# Patient Record
Sex: Male | Born: 1985
Health system: Southern US, Community
[De-identification: ages and names within clinical notes are randomized; demographics above are authoritative.]

---
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
## 2008-01-07 ENCOUNTER — Ambulatory Visit: Payer: Self-pay | Admitting: Internal Medicine

## 2009-09-29 ENCOUNTER — Ambulatory Visit: Payer: Self-pay | Admitting: Internal Medicine

## 2009-10-02 ENCOUNTER — Ambulatory Visit: Payer: Self-pay

## 2009-10-03 DIAGNOSIS — R002 Palpitations: Secondary | ICD-10-CM

## 2009-11-03 ENCOUNTER — Ambulatory Visit: Payer: Self-pay | Admitting: Internal Medicine

## 2009-11-10 ENCOUNTER — Encounter: Payer: Self-pay | Admitting: Internal Medicine

## 2010-08-09 NOTE — Assessment & Plan Note (Signed)
Summary: f2y/ gd   Visit Type:  2 yr  Primary Provider:  none  CC:  palpitations since Oct.10.  History of Present Illness: Mr Delcarlo is seen in followup for palpitations of two types, one abrupt in onset and offset, the other more gradual.  There has been a recent significant increase in frequency of episodes, now occuring 3/wk .  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 He does not use caffeine \\par  He brings in tracings from EMS today which we reviewed; These strips showed sinus tachycardia  Current Medications (verified): 1)  Claritin-D 24 Hour 10-240 Mg Xr24h-Tab (Loratadine-Pseudoephedrine) .... Prn  Allergies: 1)  ! Codeine  Vital Signs:  Patient profile:   25 year old male Height:      72 inches Weight:      190 pounds BMI:     25.86 Pulse rate:   71 / minute Pulse (ortho):   81 / minute BP sitting:   124 / 79 BP standing:   114 / 77  Vitals Entered By: Oswald Hillock (September 29, 2009 10:33 AM)  Serial Vital Signs/Assessments:  Time      Position  BP       Pulse  Resp  Temp     By 11:33 AM  Lying [place]  108/69   67                    Gypsy Balsam RN BSN 11:33 AM  Sitting   114/77   83                    Gypsy Balsam RN BSN 11:33 AM  Standing  114/77   81                    Gypsy Balsam RN BSN 1135      Standing  112/75   89                    Gypsy Balsam RN BSN 1138      Standing  110/75   85                    Gypsy Balsam RN BSN   Physical Exam  General:  The patient was alert and oriented in no acute distress. HEENT Normal.  Neck veins were flat, carotids were brisk.  Lungs were clear.  Heart sounds were regular without murmurs or gallops.  Abdomen was soft with active bowel sounds. There is no clubbing cyanosis or edema. Skin Warm and dry    Impression & Recommendations:  Problem # 1:  PALPITATIONS (ICD-785.1) The patient as different types of palpitations. The recording will  be important to try to clarify possible mechanisms and to define treatment strategies. I reviewed with him the potential possibilities and the need for specific targeting of treatment options. Orders: Event (Event)  Patient Instructions: 1)  Your physician has recommended that you wear an event monitor.  Event monitors are medical devices that record the heart's electrical activity. Doctors most often use these monitors to diagnose arrhythmias. Arrhythmias are problems with the speed or rhythm of the heartbeat. The monitor is a small, portable device. You can wear one while you do your normal daily activities. This is usually used to diagnose what is causing palpitations/syncope (passing out). 2)  Your physician recommends that you schedule a follow-up  appointment in: 1 month with Dr Graciela Husbands.

## 2010-08-09 NOTE — Procedures (Signed)
Summary: Holter and Event  Holter and Event   Imported By: Erle Crocker 11/10/2009 10:15:10  _____________________________________________________________________  External Attachment:    Type:   Image     Comment:   External Document

## 2010-08-09 NOTE — Assessment & Plan Note (Signed)
Summary: F/U EVENT MONITOR   Primary Provider:  none  CC:  f/u event monitors//.  History of Present Illness: Steven Gregory is seen in followup for palpitations of two types, one abrupt in onset and offset, the other more gradual.  At his last visit we be candidate recorder. He comes in to review it today.  His symptoms have been relatively stable. He notes that his palpitations are worse when it is hot and with prolonged upright positions.  Allergies (verified): 1)  ! Codeine  Past History:  Past Medical History: Last updated: 09/26/2009 History of tachy palpitations 2009  Notable for back pain  MRI     Past Surgical History: Last updated: 09/26/2009  Negative  Family History: Last updated: 09/26/2009 Father:  history of tachycardia  Social History: Last updated: 09/26/2009 Single  Alcohol Use - yes,drinks 5 or 6 glasses of alcohol a   week. Tobacco Use - No.  Drug Use - no  Vital Signs:  Patient profile:   25 year old male Height:      72 inches Weight:      195 pounds O2 Sat:      98 % Pulse rate:   78 / minute Pulse rhythm:   regular BP sitting:   126 / 74  (left arm) Cuff size:   regular  Vitals Entered By: Judithe Modest CMA (November 03, 2009 1:49 PM)  Physical Exam  General:  The patient was alert and oriented in no acute distress. HEENT Normal.  Neck veins were flat, carotids were brisk.  Lungs were clear.  Heart sounds were regular without murmurs or gallops.  Abdomen was soft with active bowel sounds. There is no clubbing cyanosis or edema. Skin Warm and dry    Impression & Recommendations:  Problem # 1:  PALPITATIONS (ICD-785.1) Review of the strips suggest that the are probably sinus tachycardia and probably autonomic. I continued to encourage him  in water and salt resuscitation.  We discussed the role of beta blockers and catheter ablation. I am unwilling at this point to pursue the latter and he is uninterested in the current time in  pursuing the former. We'll continue on her current course

## 2010-08-31 ENCOUNTER — Emergency Department (HOSPITAL_COMMUNITY)
Admission: EM | Admit: 2010-08-31 | Discharge: 2010-08-31 | Disposition: A | Discharge status: Home / Self Care | Payer: 59 | Attending: Emergency Medicine | Admitting: Emergency Medicine

## 2010-08-31 ENCOUNTER — Emergency Department (HOSPITAL_COMMUNITY): Payer: 59

## 2010-08-31 DIAGNOSIS — M25569 Pain in unspecified knee: Secondary | ICD-10-CM | POA: Insufficient documentation

## 2010-08-31 DIAGNOSIS — S838X9A Sprain of other specified parts of unspecified knee, initial encounter: Secondary | ICD-10-CM | POA: Insufficient documentation

## 2010-08-31 DIAGNOSIS — X58XXXA Exposure to other specified factors, initial encounter: Secondary | ICD-10-CM | POA: Insufficient documentation

## 2010-08-31 DIAGNOSIS — Y9367 Activity, basketball: Secondary | ICD-10-CM | POA: Insufficient documentation

## 2010-09-28 ENCOUNTER — Telehealth: Payer: Self-pay | Admitting: Internal Medicine

## 2010-09-28 NOTE — Telephone Encounter (Signed)
All Cardiac faxed to [facility] @ [phone] 09/28/10/KM

## 2010-11-20 NOTE — Letter (Signed)
January 07, 2008    Dr. Paulino Rily  [facility]  [phone]  [place], [place]  [address]   RE:  Steven, Gregory  MRN:  [id number]  /  DOB:  1986-01-01   Dear Dr. Yetta Barre:   It was a pleasure to see your name again.  It has been some time.  Thank  you very much for asking [place] to see Steven Gregory in consultation.   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 Lorna Few  years ago when he was a child.  These episodes could be rather fast and  regular, and he recalled of being treated with vagal maneuvers and/or  just chilling out.  His father turned out to have also a history of  tachycardia and underwent an ablation here in [place] presumably for  concealed accessory pathways which has been a long-term success.  In May  of this year, he was writing at work and found himself dyspneic while  talking.  He took his heart rate and apparently recorded a strip.  We do  not have access to what his heart rate at, but I am sure what his rate  was, identified to be between 110 and 160 beats per minute.  This lasted  for 3-4 days.  He finally got around and coming to see you, and his ECG  at that point was different.  Clearly, this was different from his other  episodes in that  a.  It was unassociated with palpitations at least at the beginning.  b.  It did not terminate abruptly.   Apart from that episode he has had no problems with chest pain,  shortness of breath, edema, or nocturnal dyspnea.   He does not take stimulants.  His thyroid status is not known as best as  I can tell.   PAST MEDICAL HISTORY:  Notable for back pain.  He is undergoing an MRI  in the next day or so to look at that.   PAST SURGICAL HISTORY:  Negative.   SOCIAL HISTORY:  He is unmarried.  He drinks 5 or 6 glasses of alcohol a  week.  He does not use cigarettes or recreational drugs.  He has no  children.   MEDICATIONS:  None.   ALLERGIES:  Codeine.   PHYSICAL EXAMINATION:  GENERAL:  A young Caucasian male, appearing in  his stated age.  VITAL SIGNS:  His blood pressure 112/82 with pulse of 68.  HEENT:  No icterus or xanthoma.  NECK:  The neck veins were flat.  The carotids are brisk and full  bilaterally without bruits.  BACK:  Without kyphosis or scoliosis.  LUNGS:  Clear.  HEART:  Sounds were regular without murmurs or gallops.  ABDOMEN:  Soft with active bowel sounds without midline pulsation or  hepatomegaly.  EXTREMITIES:  Femoral pulses were 2+.  Distal pulses were intact.  There  was no clubbing, cyanosis, or edema.  NEUROLOGIC:  Grossly normal.  SKIN:  Warm and dry.   Electrocardiogram dated today demonstrated sinus rhythm at 68 with  intervals of 0.19, 0.10, 0.37.  Notably, there was no intraventricular  pre-excitation.   IMPRESSION:  1. Palpitations of 2 different sorts,      a.     Recently with associated dyspnea and protracted for a couple       of days.  b.     Abrupt onset and offset with more rapid rates through the       child most recently at 61 years old.   DISCUSSION:  Dr. Yetta Barre, Steven Gregory has 2 different types of  palpitation syndromes.  It is not clear what the second one is.  The  first sounds that it might have bene a typical reentrant tachycardia  which would make it likely to be a concealed pathway like his father's.  This is suggested by the abrupt onset and offset as well as the response  to Valsalva and the regular nature of the tachycardia.   The most episode had quite variable rates the mechanism of which is not  clear.  He has a strip.  I have asked him to try and gets [place] a copy of  the strip, so we can try and understand the mechanism of the variable  heart rates.   As relates to followup what he wanted to do was to be established so  that if he had a problem he could get back in touch with [place] and I  thought that was  quite reasonable.   Thank you very much for asking [place] to see him.  If there is anything  further we can do, please do not hesitate to contact me.    Sincerely,      Duke Salvia, MD, [facility]  Electronically Signed    SCK/MedQ  DD: 01/07/2008  DT: 01/08/2008  Job #: [id number]

## 2011-06-18 ENCOUNTER — Ambulatory Visit: Payer: Self-pay | Admitting: Physician Assistant

## 2013-01-28 ENCOUNTER — Ambulatory Visit (HOSPITAL_COMMUNITY)
Admission: RE | Admit: 2013-01-28 | Discharge: 2013-01-28 | Disposition: A | Discharge status: Home / Self Care | Payer: 59 | Source: Ambulatory Visit | Attending: Nurse Practitioner | Admitting: Nurse Practitioner

## 2013-01-28 ENCOUNTER — Other Ambulatory Visit (HOSPITAL_COMMUNITY): Payer: Self-pay | Admitting: Nurse Practitioner

## 2013-01-28 DIAGNOSIS — R109 Unspecified abdominal pain: Secondary | ICD-10-CM

## 2015-10-25 ENCOUNTER — Ambulatory Visit
Admission: RE | Admit: 2015-10-25 | Discharge: 2015-10-25 | Disposition: A | Discharge status: Home / Self Care | Payer: 59 | Source: Ambulatory Visit | Attending: Family Medicine | Admitting: Family Medicine

## 2015-10-25 ENCOUNTER — Other Ambulatory Visit: Payer: Self-pay | Admitting: Family Medicine

## 2015-10-25 DIAGNOSIS — M545 Low back pain, unspecified: Secondary | ICD-10-CM

## 2017-02-20 DIAGNOSIS — M25551 Pain in right hip: Secondary | ICD-10-CM | POA: Diagnosis not present

## 2017-02-20 DIAGNOSIS — Z0000 Encounter for general adult medical examination without abnormal findings: Secondary | ICD-10-CM | POA: Diagnosis not present

## 2017-02-20 DIAGNOSIS — Z13220 Encounter for screening for lipoid disorders: Secondary | ICD-10-CM | POA: Diagnosis not present

## 2017-06-10 IMAGING — CR DG LUMBAR SPINE COMPLETE 4+V
5 series · 5 of 5 positions shown · non-contrast
Comparison: CT [REDACTED], plain films 03/18/2010

CLINICAL DATA: Chronic worsening LBP x 6 months / Right sciatic
area into buttock and upper thigh / no trauma / pt. states ''feels
like one side is higher than the other''

EXAM:
LUMBAR SPINE - COMPLETE 4+ VIEW

[w lumbar spine ap]
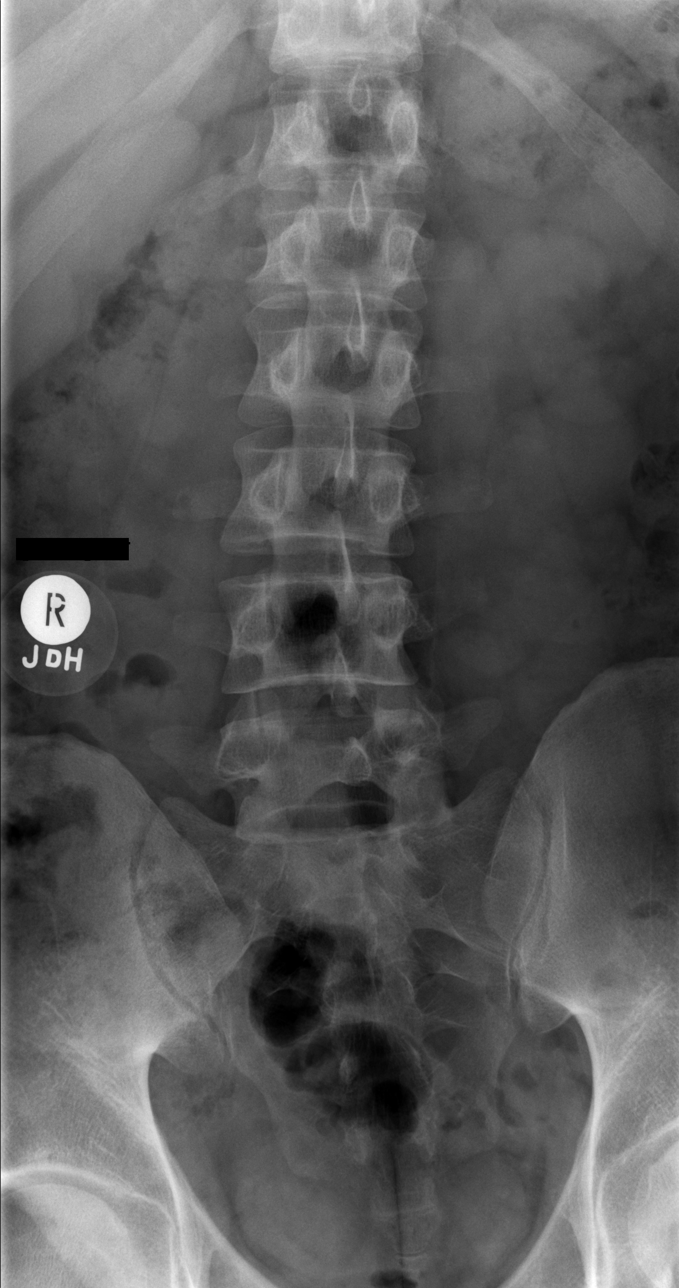

[w lumbar spine obl (1 of 2)]
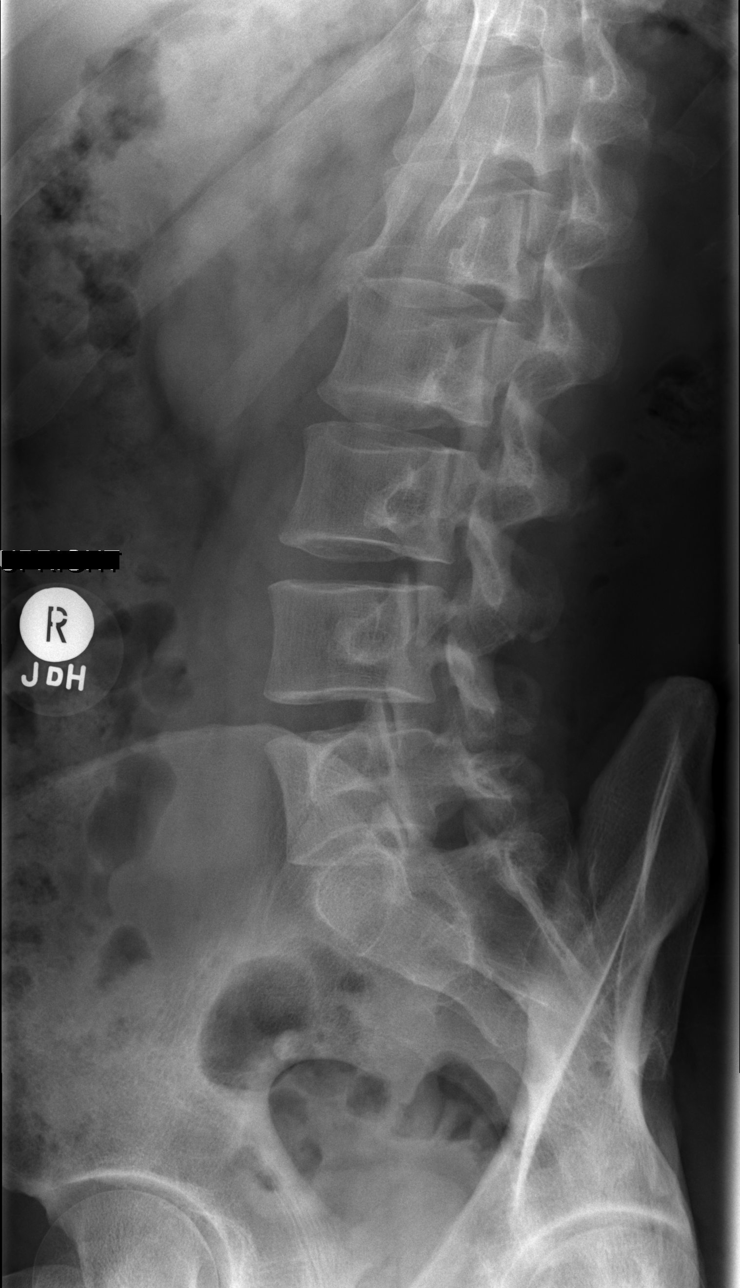

[w lumbar spine obl (2 of 2)]
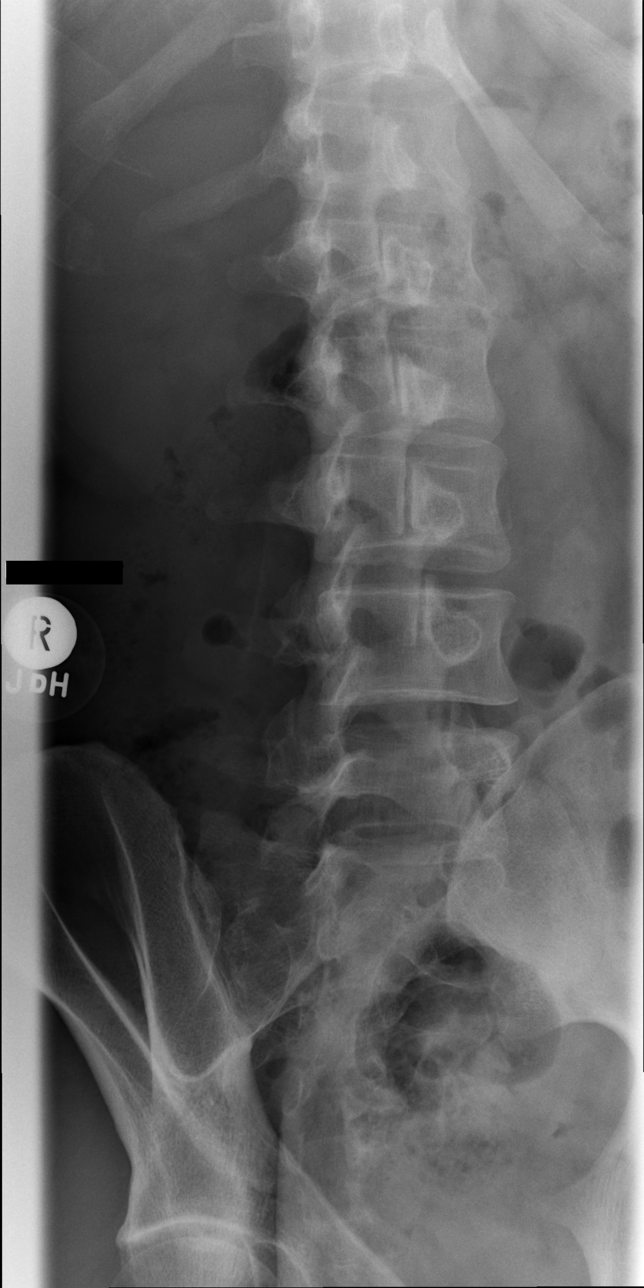

[w lumbar spine lat]
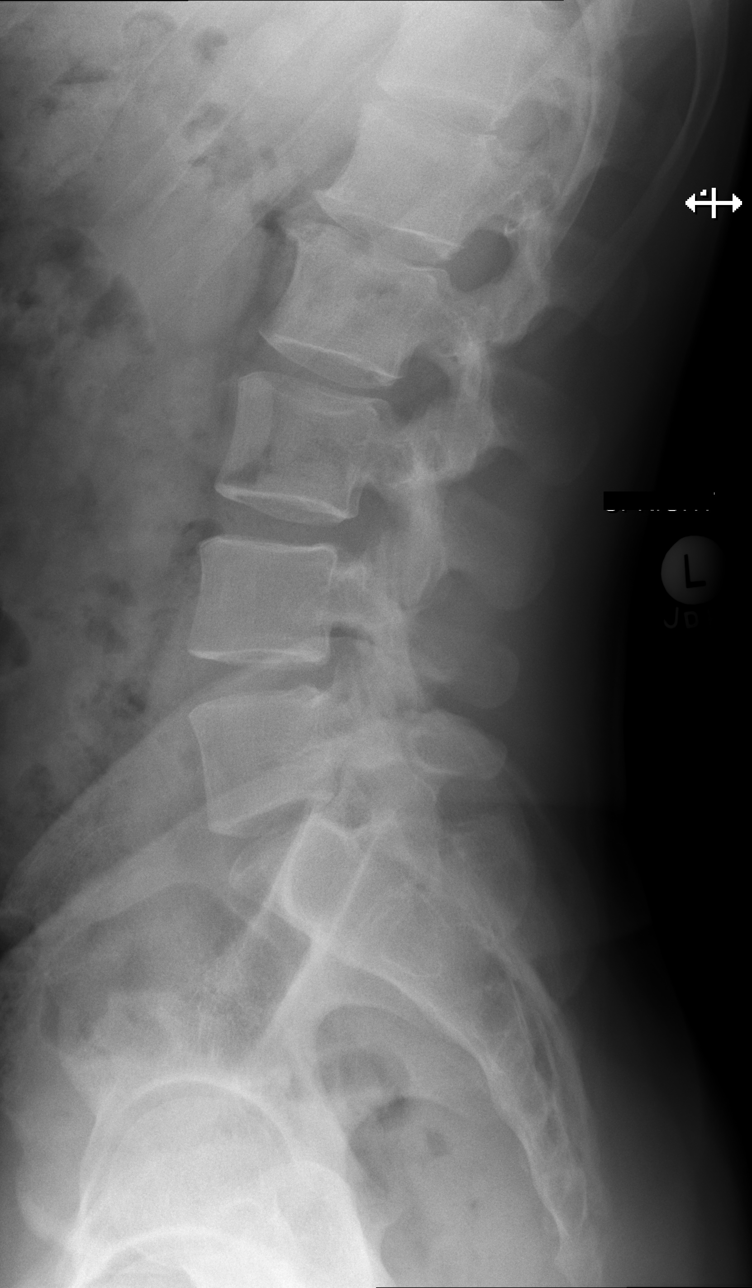

[w lumbar l-5 s-1 spot]
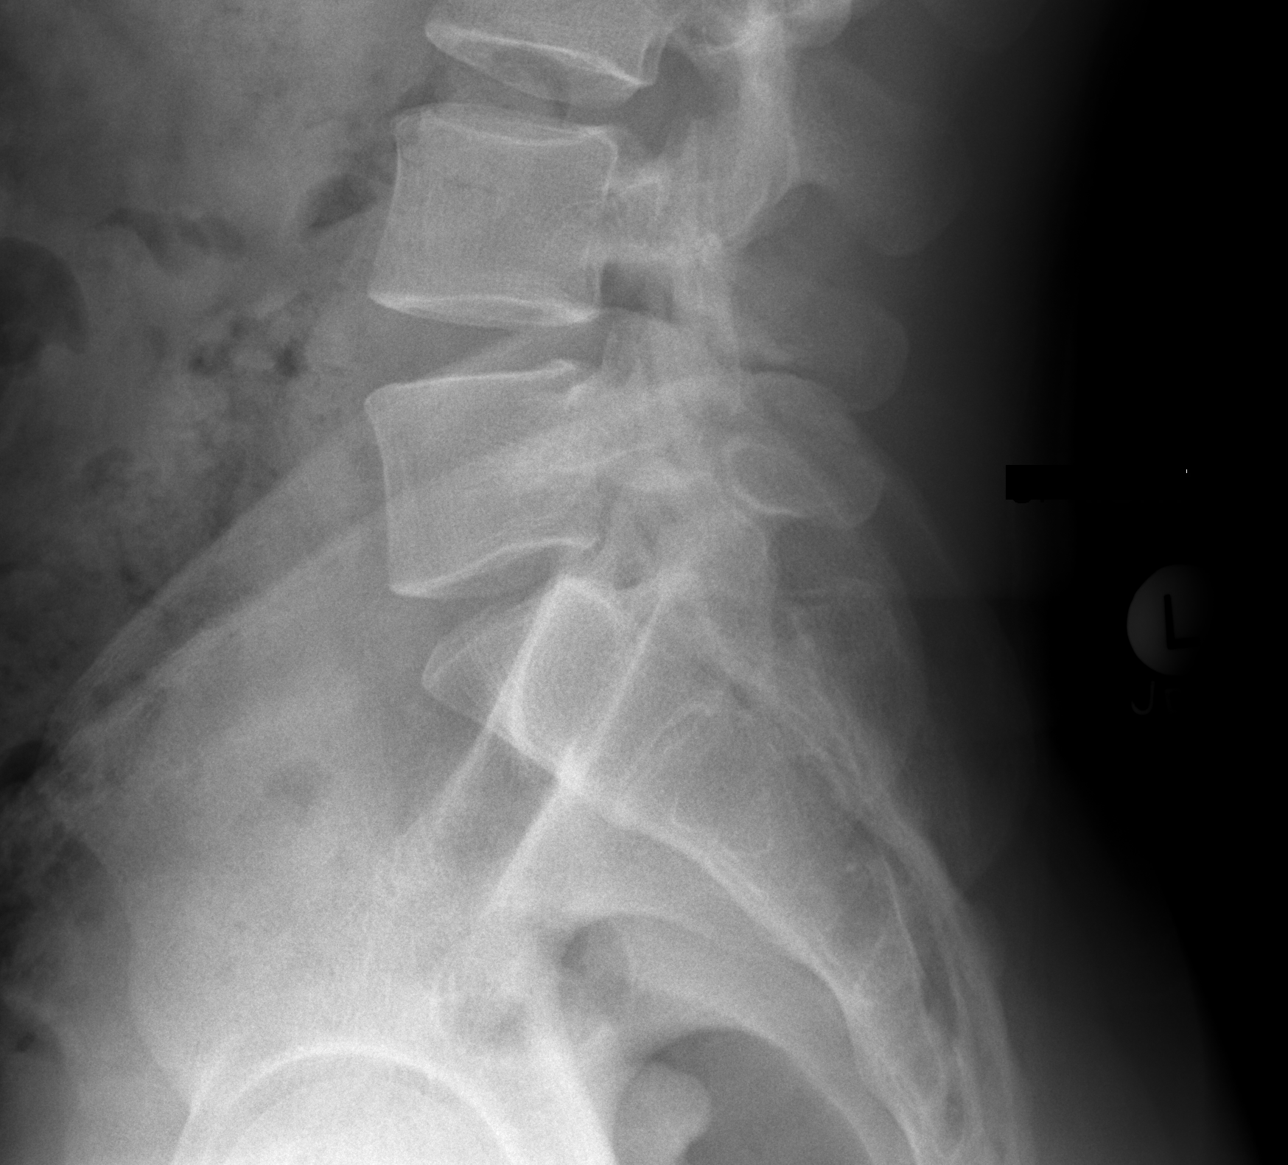

[5 of 5 positions shown; findings below may reference images not displayed]

FINDINGS: Normal alignment of lumbar vertebral bodies. No loss of vertebral
body height or disc height. No pars fracture. No subluxation. Mild
endplate spurring at L1-L2.
IMPRESSION: No acute osseous abnormality.

## 2017-08-22 DIAGNOSIS — D751 Secondary polycythemia: Secondary | ICD-10-CM | POA: Diagnosis not present

## 2018-02-24 DIAGNOSIS — Z0000 Encounter for general adult medical examination without abnormal findings: Secondary | ICD-10-CM | POA: Diagnosis not present

## 2018-03-06 DIAGNOSIS — Z136 Encounter for screening for cardiovascular disorders: Secondary | ICD-10-CM | POA: Diagnosis not present

## 2018-03-06 DIAGNOSIS — Z0000 Encounter for general adult medical examination without abnormal findings: Secondary | ICD-10-CM | POA: Diagnosis not present

## 2018-06-24 DIAGNOSIS — J069 Acute upper respiratory infection, unspecified: Secondary | ICD-10-CM | POA: Diagnosis not present

## 2018-06-29 DIAGNOSIS — J069 Acute upper respiratory infection, unspecified: Secondary | ICD-10-CM | POA: Diagnosis not present

## 2018-06-29 DIAGNOSIS — R6889 Other general symptoms and signs: Secondary | ICD-10-CM | POA: Diagnosis not present

## 2018-07-04 DIAGNOSIS — J019 Acute sinusitis, unspecified: Secondary | ICD-10-CM | POA: Diagnosis not present

## 2018-07-12 DIAGNOSIS — R05 Cough: Secondary | ICD-10-CM | POA: Diagnosis not present

## 2018-08-12 ENCOUNTER — Ambulatory Visit (INDEPENDENT_AMBULATORY_CARE_PROVIDER_SITE_OTHER): Payer: 59 | Admitting: Psychology

## 2018-08-12 DIAGNOSIS — F4323 Adjustment disorder with mixed anxiety and depressed mood: Secondary | ICD-10-CM

## 2018-08-26 ENCOUNTER — Ambulatory Visit (INDEPENDENT_AMBULATORY_CARE_PROVIDER_SITE_OTHER): Payer: 59 | Admitting: Psychology

## 2018-08-26 DIAGNOSIS — F4323 Adjustment disorder with mixed anxiety and depressed mood: Secondary | ICD-10-CM

## 2018-09-09 ENCOUNTER — Ambulatory Visit (INDEPENDENT_AMBULATORY_CARE_PROVIDER_SITE_OTHER): Payer: 59 | Admitting: Psychology

## 2018-09-09 DIAGNOSIS — F423 Hoarding disorder: Secondary | ICD-10-CM

## 2018-09-18 DIAGNOSIS — K529 Noninfective gastroenteritis and colitis, unspecified: Secondary | ICD-10-CM | POA: Diagnosis not present

## 2018-09-18 DIAGNOSIS — R1030 Lower abdominal pain, unspecified: Secondary | ICD-10-CM | POA: Diagnosis not present

## 2018-09-18 DIAGNOSIS — R197 Diarrhea, unspecified: Secondary | ICD-10-CM | POA: Diagnosis not present

## 2018-09-23 ENCOUNTER — Ambulatory Visit: Payer: 59 | Admitting: Psychology

## 2018-09-24 ENCOUNTER — Ambulatory Visit (INDEPENDENT_AMBULATORY_CARE_PROVIDER_SITE_OTHER): Payer: 59 | Admitting: Psychology

## 2018-09-24 DIAGNOSIS — F4323 Adjustment disorder with mixed anxiety and depressed mood: Secondary | ICD-10-CM | POA: Diagnosis not present

## 2018-10-08 ENCOUNTER — Ambulatory Visit (INDEPENDENT_AMBULATORY_CARE_PROVIDER_SITE_OTHER): Payer: 59 | Admitting: Psychology

## 2018-10-08 DIAGNOSIS — F4323 Adjustment disorder with mixed anxiety and depressed mood: Secondary | ICD-10-CM

## 2018-11-05 ENCOUNTER — Ambulatory Visit: Payer: 59 | Admitting: Psychology

## 2018-11-25 ENCOUNTER — Ambulatory Visit (INDEPENDENT_AMBULATORY_CARE_PROVIDER_SITE_OTHER): Payer: 59 | Admitting: Psychology

## 2018-11-25 DIAGNOSIS — F4323 Adjustment disorder with mixed anxiety and depressed mood: Secondary | ICD-10-CM | POA: Diagnosis not present

## 2018-12-11 ENCOUNTER — Ambulatory Visit (INDEPENDENT_AMBULATORY_CARE_PROVIDER_SITE_OTHER): Payer: 59 | Admitting: Psychology

## 2018-12-11 DIAGNOSIS — F4323 Adjustment disorder with mixed anxiety and depressed mood: Secondary | ICD-10-CM

## 2019-01-15 ENCOUNTER — Ambulatory Visit (INDEPENDENT_AMBULATORY_CARE_PROVIDER_SITE_OTHER): Payer: 59 | Admitting: Psychology

## 2019-01-15 DIAGNOSIS — F4323 Adjustment disorder with mixed anxiety and depressed mood: Secondary | ICD-10-CM

## 2019-01-27 ENCOUNTER — Ambulatory Visit (INDEPENDENT_AMBULATORY_CARE_PROVIDER_SITE_OTHER): Payer: 59 | Admitting: Psychology

## 2019-01-27 DIAGNOSIS — F4323 Adjustment disorder with mixed anxiety and depressed mood: Secondary | ICD-10-CM

## 2019-02-10 ENCOUNTER — Ambulatory Visit (INDEPENDENT_AMBULATORY_CARE_PROVIDER_SITE_OTHER): Payer: 59 | Admitting: Psychology

## 2019-02-10 DIAGNOSIS — F4323 Adjustment disorder with mixed anxiety and depressed mood: Secondary | ICD-10-CM

## 2019-03-01 ENCOUNTER — Ambulatory Visit (INDEPENDENT_AMBULATORY_CARE_PROVIDER_SITE_OTHER): Payer: 59 | Admitting: Psychology

## 2019-03-01 DIAGNOSIS — F4323 Adjustment disorder with mixed anxiety and depressed mood: Secondary | ICD-10-CM

## 2019-03-16 ENCOUNTER — Ambulatory Visit (INDEPENDENT_AMBULATORY_CARE_PROVIDER_SITE_OTHER): Payer: 59 | Admitting: Psychology

## 2019-03-16 DIAGNOSIS — F4323 Adjustment disorder with mixed anxiety and depressed mood: Secondary | ICD-10-CM

## 2019-05-25 ENCOUNTER — Ambulatory Visit (INDEPENDENT_AMBULATORY_CARE_PROVIDER_SITE_OTHER): Payer: 59 | Admitting: Psychology

## 2019-05-25 DIAGNOSIS — F4323 Adjustment disorder with mixed anxiety and depressed mood: Secondary | ICD-10-CM | POA: Diagnosis not present

## 2019-06-08 ENCOUNTER — Ambulatory Visit: Payer: 59 | Admitting: Psychology

## 2019-08-18 ENCOUNTER — Ambulatory Visit
Admission: EM | Admit: 2019-08-18 | Discharge: 2019-08-18 | Disposition: A | Discharge status: Home / Self Care | Payer: 59 | Attending: Emergency Medicine | Admitting: Emergency Medicine

## 2019-08-18 ENCOUNTER — Encounter: Payer: Self-pay | Admitting: Emergency Medicine

## 2019-08-18 ENCOUNTER — Other Ambulatory Visit: Payer: Self-pay

## 2019-08-18 ENCOUNTER — Ambulatory Visit (INDEPENDENT_AMBULATORY_CARE_PROVIDER_SITE_OTHER): Payer: 59

## 2019-08-18 DIAGNOSIS — M79672 Pain in left foot: Secondary | ICD-10-CM

## 2019-08-18 NOTE — Discharge Instructions (Signed)

## 2019-08-18 NOTE — ED Provider Notes (Signed)
[facility]    CSN: [id number] Arrival date & time: 08/18/19  1912      History   Chief Complaint Chief Complaint  Patient presents with  . Foot Pain    HPI Steven Gregory is a 34 y.o. male never assessment of left foot pain.  States initially in 5 to 6 weeks ago after increased activity level (long months).  States last night his son patient tried car into his foot which caused him to jump when he landed felt a severe sharp pain that went from the lateral aspect of his foot to his ankle.  Patient does have mild antalgic gait, states it has been bothering him while he is at work and with exertion.  Has used ibuprofen with some relief.   History reviewed. No pertinent past medical history.  Patient Active Problem List   Diagnosis Date Noted  . PALPITATIONS 10/03/2009    Past Surgical History:  Procedure Laterality Date  . ANTERIOR CRUCIATE LIGAMENT REPAIR Right        Home Medications    Prior to Admission medications   Medication Sig Start Date End Date Taking? Authorizing Provider  amphetamine-dextroamphetamine (ADDERALL XR) 20 MG 24 hr capsule Take 20 mg by mouth daily.   Yes [provider]    Family History Family History  Problem Relation Age of Onset  . Kidney Stones Mother     Social History Social History   Tobacco Use  . Smoking status: Never Smoker  . Smokeless tobacco: Current User    Types: Chew  Substance Use Topics  . Alcohol use: Yes    Comment: socially  . Drug use: Never     Allergies   Codeine and Penicillins   Review of Systems As per HPI   Physical Exam Triage Vital Signs ED Triage Vitals [08/18/19 1920]  Enc Vitals Group     BP (!) 155/98     Pulse Rate 96     Resp 16     Temp      Temp src      SpO2 98 %     Weight      Height      Head Circumference      Peak Flow      Pain Score 6     Pain Loc      Pain Edu?      Excl. in [place]?    No data found.  Updated Vital Signs BP (!)  147/91 (BP Location: Left Arm)   Pulse 96   Resp 16   SpO2 98%   Visual Acuity Right Eye Distance:   Left Eye Distance:   Bilateral Distance:    Right Eye Near:   Left Eye Near:    Bilateral Near:     Physical Exam Constitutional:      General: He is not in acute distress. HENT:     Head: Normocephalic and atraumatic.  Eyes:     General: No scleral icterus.    Pupils: Pupils are equal, round, and reactive to light.  Cardiovascular:     Rate and Rhythm: Normal rate.  Pulmonary:     Effort: Pulmonary effort is normal. No respiratory distress.     Breath sounds: No wheezing.  Musculoskeletal:     Right ankle: Normal.     Right Achilles Tendon: Normal.     Left ankle: Normal.     Left Achilles Tendon: Normal.     Right foot: Normal.  Left foot: Normal range of motion and normal capillary refill. Bony tenderness present. No prominent metatarsal heads, tenderness or crepitus. Normal pulse.       Legs:     Comments: TTP in red  Skin:    Coloration: Skin is not jaundiced or pale.  Neurological:     Mental Status: He is alert and oriented to person, place, and time.      UC Treatments / Results  Labs (all labs ordered are listed, but only abnormal results are displayed) Labs Reviewed - No data to display  EKG   Radiology DG Foot Complete Left  Result Date: 08/18/2019 CLINICAL DATA:  Acute on chronic pain, left foot pain for 5-6 weeks EXAM: LEFT FOOT - COMPLETE 3+ VIEW COMPARISON:  None. FINDINGS: Frontal, oblique, lateral views of the left foot are obtained. No fracture, subluxation, or dislocation. Joint spaces are well preserved. Soft tissues are normal. IMPRESSION: 1. Unremarkable left foot. Electronically Signed   By: Sharlet Salina M.D.   On: 08/18/2019 19:43    Procedures Procedures (including critical care time)  Medications Ordered in UC Medications - No data to display  Initial Impression / Assessment and Plan / UC Course  I have reviewed the  triage vital signs and the nursing notes.  Pertinent labs & imaging results that were available during my care of the patient were reviewed by me and considered in my medical decision making (see chart for details).     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  X-ray done in office, reviewed by me radiology: Unremarkable for dislocation, fracture.  Reviewed findings with patient verbalized understanding.  Will practice RICE as outlined below.  Return precautions discussed, patient verbalized understanding and is agreeable to plan. Final Clinical Impressions(s) / UC Diagnoses   Final diagnoses:  Left foot pain     Discharge Instructions     Recommend RICE: rest, ice, compression, elevation as needed for pain.    Heat therapy (hot compress, warm wash red, hot showers, etc.) can help relax muscles and soothe muscle aches. Cold therapy (ice packs) can be used to help swelling both after injury and after prolonged use of areas of chronic pain/aches.  For pain: recommend 350 mg-1000 mg of Tylenol (acetaminophen) and/or 200 mg - 800 mg of Advil (ibuprofen, Motrin) every 8 hours as needed.  May alternate between the two throughout the day as they are generally safe to take together.  DO NOT exceed more than 3000 mg of Tylenol or 3200 mg of ibuprofen in a 24 hour period as this could damage your stomach, kidneys, liver, or increase your bleeding risk.    ED Prescriptions    None     PDMP not reviewed this encounter.   [place], [place], [place] 08/18/19 2001

## 2019-08-18 NOTE — ED Triage Notes (Signed)
Pt presents to [facility] for assessment of 5-6 weeks of left foot pain beginning after a long run.  States he was reducing exertion and use, using ibuprofen, and it was improving.  States his son pushed a toy car into his foot last night and he jumped, and when he came down to land on it he felt a severe pain that shot up into his leg and "wrapped around my ankle".    Ankle pain is now intermittent with certain movements, and gait is abnormal due to pain.

## 2019-11-03 ENCOUNTER — Other Ambulatory Visit: Payer: Self-pay

## 2019-11-03 ENCOUNTER — Ambulatory Visit (INDEPENDENT_AMBULATORY_CARE_PROVIDER_SITE_OTHER): Payer: 59 | Admitting: Mental Health

## 2019-11-03 DIAGNOSIS — F4323 Adjustment disorder with mixed anxiety and depressed mood: Secondary | ICD-10-CM | POA: Diagnosis not present

## 2019-11-03 NOTE — Progress Notes (Signed)
[facility] Initial Adult Exam  Name: Steven Gregory Date: 11/03/2019 MRN: [id number] DOB: Dec 09, 1985 PCP: Patient, No Pcp Per  Time spent: 53 minutes  Reason for Visit /Presenting Problem:  Pt wants to continue to grow, learn more about himself. He was in therapy last year for about 9 months. His wife was coping w/ post-partum. He was coping w/ some guilt/shame.  Captain in the paramedic team, he began talking with a coworker with which he spent many hours a day.  He was busy, wife was pregnant, he would do a lot around the house, tired often. Began to talk to this male co-worker where they eventually had an affair, he still has some feelings for her. He had to live w/ friends as he and his wife separated. He continued to see this coworker and at times his wife. He and his wife are currently trying to work things out.  Due to his continuing to have some feelings for the coworker with which he had the affair, he wants to improve his ability to further understand them and how they are continuing to impact his life.  He stated he is having some dreams over the past month where she is often in them, the dreams are not disturbing but that she present at some point in them.  One of his closest friends of many years is now dating her and he has had to set boundaries with them, cutting off communication and with another close friend of many years which is also now friends with her.  Mental Status Exam:   Appearance:   Casual     Behavior:  Appropriate  Motor:  Normal  Speech/Language:   Clear and Coherent  Affect:  Full Range  Mood:  anxious  Thought process:  normal  Thought content:    WNL  Sensory/Perceptual disturbances:    WNL  Orientation:  x4  Attention:  Good  Concentration:  Good  Memory:  WNL  Fund of knowledge:   Good  Insight:    Good  Judgment:   Good  Impulse Control:  Good   Reported Symptoms:   Anxiety, depressed mood, some sleep deficits, rumination at  times  Risk Assessment: Danger to Self:  No Self-injurious Behavior: No Danger to Others: No Duty to Warn:no Physical Aggression / Violence:No  Access to Firearms a concern: No  Gang Involvement:no  Patient / guardian was educated about steps to take if suicide or homicide risk level increases between visits: yes While future psychiatric events cannot be accurately predicted, the patient does not currently require acute inpatient psychiatric care and does not currently meet [facility] involuntary commitment criteria.  Substance Abuse History: Current substance abuse: denied   Past Psychiatric History:   Outpatient Providers: last year for about 9 months istory of Psych Hospitalization: No  Psychological Testing: none   Family History:  Family History  Problem Relation Age of Onset  . Kidney Stones Mother     Medical History/Surgical History: No past medical history on file.  Past Surgical History:  Procedure Laterality Date  . ANTERIOR CRUCIATE LIGAMENT REPAIR Right     Medications: Current Outpatient Medications  Medication Sig Dispense Refill  . amphetamine-dextroamphetamine (ADDERALL XR) 20 MG 24 hr capsule Take 20 mg by mouth daily.     No current facility-administered medications for this visit.    Allergies  Allergen Reactions  . Codeine   . Penicillins Hives    Diagnoses:    ICD-10-CM   1.  Adjustment disorder with mixed anxiety and depressed mood  F43.23     Plan of Care: TBD  Anson [place], [facility]

## 2019-11-25 ENCOUNTER — Ambulatory Visit: Payer: 59 | Admitting: Mental Health

## 2019-12-24 ENCOUNTER — Other Ambulatory Visit: Payer: Self-pay

## 2019-12-24 ENCOUNTER — Ambulatory Visit (INDEPENDENT_AMBULATORY_CARE_PROVIDER_SITE_OTHER): Payer: 59 | Admitting: Mental Health

## 2019-12-24 DIAGNOSIS — F4323 Adjustment disorder with mixed anxiety and depressed mood: Secondary | ICD-10-CM

## 2019-12-24 NOTE — Progress Notes (Signed)
[facility] Psychotherapy Note  Name: Steven Gregory Date: 12/24/2019 MRN: [id number] DOB: March 15, 1986 PCP: Patient, No Pcp Per  Time spent: 53 minutes  Treatment:   Individual therapy  Mental Status Exam:   Appearance:   Casual     Behavior:  Appropriate  Motor:  Normal  Speech/Language:   Clear and Coherent  Affect:  Full Range  Mood:  anxious  Thought process:  normal  Thought content:    WNL  Sensory/Perceptual disturbances:    WNL  Orientation:  x4  Attention:  Good  Concentration:  Good  Memory:  WNL  Fund of knowledge:   Good  Insight:    Good  Judgment:   Good  Impulse Control:  Good   Reported Symptoms:   Anxiety, depressed mood, some sleep deficits, rumination at times  Risk Assessment: Danger to Self:  No Self-injurious Behavior: No Danger to Others: No Duty to Warn:no Physical Aggression / Violence:No  Access to Firearms a concern: No  Gang Involvement:no  Patient / guardian was educated about steps to take if suicide or homicide risk level increases between visits: yes While future psychiatric events cannot be accurately predicted, the patient does not currently require acute inpatient psychiatric care and does not currently meet [facility] involuntary commitment criteria.  Substance Abuse History: Current substance abuse: denied   Past Psychiatric History:   Outpatient Providers: last year for about 9 months istory of Psych Hospitalization: No  Psychological Testing: none   Family History:  Family History  Problem Relation Age of Onset  . Kidney Stones Mother    Medications: Current Outpatient Medications  Medication Sig Dispense Refill  . amphetamine-dextroamphetamine (ADDERALL XR) 20 MG 24 hr capsule Take 20 mg by mouth daily.     No current facility-administered medications for this visit.    Allergies  Allergen Reactions  . Codeine   . Penicillins Hives   Subective: Patient presents for session on time.  We discussed  progress since last visit which was about 2 months ago.  He stated that he and his wife continue to work on the relationship and reports they are doing well.  Shared how they have a beach trip coming up this weekend and he looks forward to spending time together.  Through guided discovery, he identified wanting to "appropriate feelings of guilt".  He has been able to use his support system consisting of his mother and a close friend where he identified feeling responsible for negatively affecting the life of the woman he had an affair with last year.  He processed thoughts and feelings related to how he further understands more about that relationship at the time, how he feels she manipulated him in some ways, playing the victim at times to get him to stay in the relationship particularly when he tried to end it multiple times.  He notified feeling more guarded now with others in part due to the loss of one of his best friends as he began dating this woman.  She plans to take steps toward being somewhat more disclosing with others as he wants to change this recent urges to be more guarded around anyone who comes in contact with.  He identified struggling with trusting others and himself.   Interventions: CBT, supportive therapy  Diagnoses:    ICD-10-CM   1. Adjustment disorder with mixed anxiety and depressed mood  F43.23    Plan: Patient is to use CBT, mindfulness and coping skills to help manage decrease symptoms associated  with their diagnosis.  Patient to continue to identify and manage distressful feelings related to his relationships, taking steps toward continuing to improve his marital relationship and self forgiveness.   Long-term goal:   Reduce overall level, frequency, and intensity of the feelings of depression and anxiety 6-7/10 to a 0-2/10 in severity for at least 3 consecutive months.  Short-term goal:  Decrease "deflating, self-doubting" and catastrophizing thinking style Decrease  feelings of guilt  Continue to improve his marital relationship Utilize coping as discussed in session   Assessment of progress:  progressing  Anson [place], [facility]

## 2020-01-26 ENCOUNTER — Other Ambulatory Visit: Payer: Self-pay

## 2020-01-26 ENCOUNTER — Ambulatory Visit (INDEPENDENT_AMBULATORY_CARE_PROVIDER_SITE_OTHER): Payer: 59 | Admitting: Mental Health

## 2020-01-26 DIAGNOSIS — F4323 Adjustment disorder with mixed anxiety and depressed mood: Secondary | ICD-10-CM | POA: Diagnosis not present

## 2020-01-26 NOTE — Progress Notes (Signed)
[facility] Psychotherapy Note  Name: Steven Gregory Date: 01/26/2020 MRN: [id number] DOB: April 30, 1986 PCP: Patient, No Pcp Per  Time spent: 54 minutes  Treatment:   Individual therapy  Mental Status Exam:   Appearance:   Casual     Behavior:  Appropriate  Motor:  Normal  Speech/Language:   Clear and Coherent  Affect:  Full Range  Mood:  euthymic  Thought process:  normal  Thought content:    WNL  Sensory/Perceptual disturbances:    WNL  Orientation:  x4  Attention:  Good  Concentration:  Good  Memory:  WNL  Fund of knowledge:   Good  Insight:    Good  Judgment:   Good  Impulse Control:  Good   Reported Symptoms:   Anxiety, depressed mood, some sleep deficits, rumination at times  Risk Assessment: Danger to Self:  No Self-injurious Behavior: No Danger to Others: No Duty to Warn:no Physical Aggression / Violence:No  Access to Firearms a concern: No  Gang Involvement:no  Patient / guardian was educated about steps to take if suicide or homicide risk level increases between visits: yes While future psychiatric events cannot be accurately predicted, the patient does not currently require acute inpatient psychiatric care and does not currently meet [facility] involuntary commitment criteria.  Substance Abuse History: Current substance abuse: denied   Past Psychiatric History:   Outpatient Providers: last year for about 9 months istory of Psych Hospitalization: No  Psychological Testing: none   Family History:  Family History  Problem Relation Age of Onset  . Kidney Stones Mother    Medications: Current Outpatient Medications  Medication Sig Dispense Refill  . amphetamine-dextroamphetamine (ADDERALL XR) 20 MG 24 hr capsule Take 20 mg by mouth daily.     No current facility-administered medications for this visit.    Allergies  Allergen Reactions  . Codeine   . Penicillins Hives   Subective: Patient presents for session on time in no  distress.  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  Some struggle identified emotionally when not feeling that he can be as helpful as he would like in some situations when he has to talk to family members of people who have passed away as an example.  Reminding himself that he is doing everything he can in the situations was identified in the session and he plans to remind himself of this and these types of instances which can occur somewhat frequently with his line of work.  Continues to share how his marriage is going well, feels that he and his wife have improved communication over the last few weeks.  He shared how he is also work toward reframing thoughts associated with guilty feelings he was experiencing over the last few months.  We review and discuss how identifying steps he is taking day-to-day in terms of improving his communication in his marriage, avoiding the suppression of feelings as steps toward growth and healing.  He plans to continue this process between sessions.   Interventions: CBT, supportive therapy  Diagnoses:    ICD-10-CM   1. Adjustment disorder with mixed anxiety and depressed mood  F43.23      Plan: Patient is to use CBT, mindfulness and coping skills to help manage decrease symptoms associated with their diagnosis.  Patient to continue to identify and manage distressful feelings related to his relationships, taking steps toward continuing to improve his marital relationship and self  forgiveness.   Long-term goal:   Reduce overall level, frequency, and intensity of the feelings of depression and anxiety 6-7/10 to a 0-2/10 in severity for at least 3 consecutive months.  Short-term goal:  Decrease "deflating, self-doubting" and catastrophizing thinking style Decrease feelings of guilt  Continue to improve his marital relationship Utilize coping as discussed in session   Assessment of progress:   progressing  Waldron Session, [facility]

## 2020-02-09 ENCOUNTER — Ambulatory Visit (INDEPENDENT_AMBULATORY_CARE_PROVIDER_SITE_OTHER): Payer: 59 | Admitting: Mental Health

## 2020-02-09 ENCOUNTER — Other Ambulatory Visit: Payer: Self-pay

## 2020-02-09 DIAGNOSIS — F4323 Adjustment disorder with mixed anxiety and depressed mood: Secondary | ICD-10-CM | POA: Diagnosis not present

## 2020-02-09 NOTE — Progress Notes (Signed)
[facility] Psychotherapy Note  Name: Steven Gregory Date: 02/09/2020 MRN: [id number] DOB: 1985-11-30 PCP: Patient, No Pcp Per  Time spent: 55 minutes  Treatment:   Individual therapy  Mental Status Exam:   Appearance:   Casual     Behavior:  Appropriate  Motor:  Normal  Speech/Language:   Clear and Coherent  Affect:  Full Range  Mood:  euthymic  Thought process:  normal  Thought content:    WNL  Sensory/Perceptual disturbances:    WNL  Orientation:  x4  Attention:  Good  Concentration:  Good  Memory:  WNL  Fund of knowledge:   Good  Insight:    Good  Judgment:   Good  Impulse Control:  Good   Reported Symptoms:   Anxiety, depressed mood, some sleep deficits, rumination at times  Risk Assessment: Danger to Self:  No Self-injurious Behavior: No Danger to Others: No Duty to Warn:no Physical Aggression / Violence:No  Access to Firearms a concern: No  Gang Involvement:no  Patient / guardian was educated about steps to take if suicide or homicide risk level increases between visits: yes While future psychiatric events cannot be accurately predicted, the patient does not currently require acute inpatient psychiatric care and does not currently meet [facility] involuntary commitment criteria.  Substance Abuse History: Current substance abuse: denied   Past Psychiatric History:   Outpatient Providers: last year for about 9 months istory of Psych Hospitalization: No  Psychological Testing: none   Family History:  Family History  Problem Relation Age of Onset  . Kidney Stones Mother    Medications: Current Outpatient Medications  Medication Sig Dispense Refill  . amphetamine-dextroamphetamine (ADDERALL XR) 20 MG 24 hr capsule Take 20 mg by mouth daily.     No current facility-administered medications for this visit.    Allergies  Allergen Reactions  . Codeine   . Penicillins Hives   Subective:  Patient presents for session in no distress.  He  shared how he and his wife continue to work on the relationship, communication.  He shared how his anxiety has been manageable, less rumination.  He is worked to be more active listener when his wife speaking, trying not to "solve" problems when mentioned, more to provide support and understanding when she talks about various stressors with which she may cope.  He stated she has a tendency to suppress talking through issues, may be irritable and then in turn his defenses would go up.  He stated there changing, working in this area specifically to be more open with communication.  He makes effort to encourage her to talk through issues that may be upsetting her regardless of what it may be about.  He said he looks forward to more time together, they have a vacation coming up next week where their son who is age 56, will be with the grandparents.  This will give him time to be alone and we continue to explore collaboratively ways to communicate in the relationship with a focus on active listening and examples given.  Some anxiety identified related to his parents not being vaccinated from COVID-19.  He and his wife are vaccinated but there are 9-year-old son is not due to his age.  Through guided discovery, he identified his tendency to not express his needs even if him emotionally stressed, how this relates to when he was forced to self soothe often in early childhood due to his parents unavailability at that time.  He stated they are  now wonderful grandparents and he has been able to talk with him about some of these past experiences.  He shared how this has been evident in his marital relationship, this tendency to suppress that he is taking steps to change and be more communicative for his own needs as well.   Interventions: CBT, supportive therapy  Diagnoses:    ICD-10-CM   1. Adjustment disorder with mixed anxiety and depressed mood  F43.23      Plan: Patient is to use CBT, mindfulness and coping skills  to help manage decrease symptoms associated with their diagnosis.  Patient to continue to identify and manage distressful feelings related to his relationships, taking steps toward continuing to improve his marital relationship and self forgiveness.   Long-term goal:   Reduce overall level, frequency, and intensity of the feelings of depression and anxiety 6-7/10 to a 0-2/10 in severity for at least 3 consecutive months.  Short-term goal:  Decrease "deflating, self-doubting" and catastrophizing thinking style Decrease feelings of guilt  Continue to improve his marital relationship through affective communication Utilize coping as discussed in session   Assessment of progress:  progressing  Waldron Session, [facility]

## 2020-02-29 ENCOUNTER — Ambulatory Visit (INDEPENDENT_AMBULATORY_CARE_PROVIDER_SITE_OTHER): Payer: 59 | Admitting: Mental Health

## 2020-02-29 ENCOUNTER — Other Ambulatory Visit: Payer: Self-pay

## 2020-02-29 DIAGNOSIS — F4323 Adjustment disorder with mixed anxiety and depressed mood: Secondary | ICD-10-CM

## 2020-02-29 NOTE — Progress Notes (Signed)
[facility] Psychotherapy Note  Name: Steven Gregory Date: 02/29/2020 MRN: [id number] DOB: Jul 24, 1985 PCP: Patient, No Pcp Per  Time spent: 54 minutes  Treatment:   Individual therapy  Mental Status Exam:   Appearance:   Casual     Behavior:  Appropriate  Motor:  Normal  Speech/Language:   Clear and Coherent  Affect:  Full Range  Mood:  euthymic  Thought process:  normal  Thought content:    WNL  Sensory/Perceptual disturbances:    WNL  Orientation:  x4  Attention:  Good  Concentration:  Good  Memory:  WNL  Fund of knowledge:   Good  Insight:    Good  Judgment:   Good  Impulse Control:  Good   Reported Symptoms:   Anxiety, depressed mood, some sleep deficits, rumination at times  Risk Assessment: Danger to Self:  No Self-injurious Behavior: No Danger to Others: No Duty to Warn:no Physical Aggression / Violence:No  Access to Firearms a concern: No  Gang Involvement:no  Patient / guardian was educated about steps to take if suicide or homicide risk level increases between visits: yes While future psychiatric events cannot be accurately predicted, the patient does not currently require acute inpatient psychiatric care and does not currently meet [facility] involuntary commitment criteria.  Medications: Current Outpatient Medications  Medication Sig Dispense Refill  . amphetamine-dextroamphetamine (ADDERALL XR) 20 MG 24 hr capsule Take 20 mg by mouth daily.     No current facility-administered medications for this visit.    Allergies  Allergen Reactions  . Codeine   . Penicillins Hives   Subective:  Patient presents for today's session in no distress.  He stated that he and his wife were able to go on a recent vacation together while their son stayed with grandparents.  He stated it was helpful for them as a couple to have this time together sharing some of their communication.  He stated she may have moments that remind her of the pain of his  infidelity that occur after several weeks but they are able to talk through it, his stating that he provides emotional support in her making efforts to be open and honest with him when these feelings surface.  He stated he has made an effort to be more forthcoming about his feelings, not to suppress them as discussed last session.  He stated he told her recently that he was having a difficult day and she was supportive of his being open and honest with her.  He stated that the woman with which she had the affair and is now dating a work Animator.  He stated that this does not upset him but he does wonder if she is doing this to somehow try and get his attention.  Either way, he verbalized how he has worked to let go of these types of thoughts the coming to his mind.  Encouraged him to identify how he would like to reframe this in his mind where he was able to verbalize how he wants her to be happy and future relationships while also knowing that he needs to keep a boundary that he feels is healthy for himself, his wife and also for her.  One way in doing this is to not go on a planned fishing trip with the colleague which he also is a friend.   Interventions: CBT, supportive therapy  Diagnoses:    ICD-10-CM   1. Adjustment disorder with mixed anxiety and depressed mood  F43.23  Plan: Patient is to use CBT, mindfulness and coping skills to help manage decrease symptoms associated with their diagnosis.    Patient to continue to work on his more open communication in his marital relationship.   Long-term goal:   Reduce overall level, frequency, and intensity of the feelings of depression and anxiety 6-7/10 to a 0-2/10 in severity for at least 3 consecutive months.  Short-term goal:  Decrease "deflating, self-doubting" and catastrophizing thinking style Decrease feelings of guilt  Continue to improve his marital relationship through affective communication Utilize coping as discussed in session    Assessment of progress:  progressing  Waldron Session, [facility]

## 2020-03-23 ENCOUNTER — Ambulatory Visit: Payer: 59 | Admitting: Mental Health

## 2020-04-06 ENCOUNTER — Ambulatory Visit (INDEPENDENT_AMBULATORY_CARE_PROVIDER_SITE_OTHER): Payer: 59 | Admitting: Mental Health

## 2020-04-06 ENCOUNTER — Other Ambulatory Visit: Payer: Self-pay

## 2020-04-06 DIAGNOSIS — F4323 Adjustment disorder with mixed anxiety and depressed mood: Secondary | ICD-10-CM

## 2020-04-06 NOTE — Progress Notes (Signed)
[facility] Psychotherapy Note  Name: Steven Gregory Date: 04/06/2020 MRN: [id number] DOB: 1985-10-27 PCP: Patient, No Pcp Per  Time spent: 55 minutes  Treatment:   Individual therapy  Mental Status Exam:   Appearance:   Casual     Behavior:  Appropriate  Motor:  Normal  Speech/Language:   Clear and Coherent  Affect:  Full Range  Mood:  euthymic  Thought process:  normal  Thought content:    WNL  Sensory/Perceptual disturbances:    WNL  Orientation:  x4  Attention:  Good  Concentration:  Good  Memory:  WNL  Fund of knowledge:   Good  Insight:    Good  Judgment:   Good  Impulse Control:  Good   Reported Symptoms:   Anxiety, depressed mood, some sleep deficits, rumination at times  Risk Assessment: Danger to Self:  No Self-injurious Behavior: No Danger to Others: No Duty to Warn:no Physical Aggression / Violence:No  Access to Firearms a concern: No  Gang Involvement:no  Patient / guardian was educated about steps to take if suicide or homicide risk level increases between visits: yes While future psychiatric events cannot be accurately predicted, the patient does not currently require acute inpatient psychiatric care and does not currently meet [facility] involuntary commitment criteria.  Medications: Current Outpatient Medications  Medication Sig Dispense Refill  . amphetamine-dextroamphetamine (ADDERALL XR) 20 MG 24 hr capsule Take 20 mg by mouth daily.     No current facility-administered medications for this visit.    Allergies  Allergen Reactions  . Codeine   . Penicillins Hives   Subective:  Patient presents for session on he shared progress.  He stated that work has been more stressful recently due to the demands in the community and having limited resources at times.  He stated that the pandemic has been challenging to maintain services in the community consistently, therefore an outside entity is assisting.  He stated that he has  identified the need to take time off to cope and care for himself and has been off for the last several days.  He went on to share experiences with his son, continues to work on his marital relationship which she stated continues to improve.  He continues to take his medications prescribed by his family doctor to treat ADHD.  States if he does not take the medication he is much more impulsive and irritable with significant deficits with concentration and focus.  He checked his heart rate in session which was elevated to 115 bpm.  He stated he made his family doctor aware plans to decrease dosage over the next few days.  We discussed the option of having a psychiatric eval at our practice for med management, also copes with some levels of anxiety as well.  He stated he plans to consider and will schedule possibly in the coming days.   Interventions: CBT, supportive therapy  Diagnoses:    ICD-10-CM   1. Adjustment disorder with mixed anxiety and depressed mood  F43.23      Plan: Patient is to use CBT, mindfulness and coping skills to help manage decrease symptoms associated with their diagnosis.    Patient to continue to work on his more open communication in his marital relationship.   Long-term goal:   Reduce overall level, frequency, and intensity of the feelings of depression and anxiety 6-7/10 to a 0-2/10 in severity for at least 3 consecutive months.  Short-term goal:  Decrease "deflating, self-doubting" and catastrophizing thinking style  Decrease feelings of guilt  Continue to improve his marital relationship through affective communication Utilize coping as discussed in session   Assessment of progress:  progressing  Waldron Session, [facility]

## 2020-04-20 ENCOUNTER — Ambulatory Visit: Payer: 59 | Admitting: Mental Health

## 2020-04-20 ENCOUNTER — Ambulatory Visit (INDEPENDENT_AMBULATORY_CARE_PROVIDER_SITE_OTHER): Payer: 59 | Admitting: Mental Health

## 2020-04-20 ENCOUNTER — Other Ambulatory Visit: Payer: Self-pay

## 2020-04-20 DIAGNOSIS — F4323 Adjustment disorder with mixed anxiety and depressed mood: Secondary | ICD-10-CM | POA: Diagnosis not present

## 2020-04-20 NOTE — Progress Notes (Signed)
[facility] Psychotherapy Note  Name: Steven Gregory Date: 04/20/2020 MRN: [id number] DOB: 05-Aug-1985 PCP: Patient, No Pcp Per  Time spent: 51 minutes   Treatment:   Individual therapy  Mental Status Exam:   Appearance:   Casual     Behavior:  Appropriate  Motor:  Normal  Speech/Language:   Clear and Coherent  Affect:  Full Range  Mood:  euthymic  Thought process:  normal  Thought content:    WNL  Sensory/Perceptual disturbances:    WNL  Orientation:  x4  Attention:  Good  Concentration:  Good  Memory:  WNL  Fund of knowledge:   Good  Insight:    Good  Judgment:   Good  Impulse Control:  Good   Reported Symptoms:   Anxiety, depressed mood, some sleep deficits, rumination at times  Risk Assessment: Danger to Self:  No Self-injurious Behavior: No Danger to Others: No Duty to Warn:no Physical Aggression / Violence:No  Access to Firearms a concern: No  Gang Involvement:no  Patient / guardian was educated about steps to take if suicide or homicide risk level increases between visits: yes While future psychiatric events cannot be accurately predicted, the patient does not currently require acute inpatient psychiatric care and does not currently meet [facility] involuntary commitment criteria.  Medications: Current Outpatient Medications  Medication Sig Dispense Refill  . amphetamine-dextroamphetamine (ADDERALL XR) 20 MG 24 hr capsule Take 20 mg by mouth daily.     No current facility-administered medications for this visit.    Allergies  Allergen Reactions  . Codeine   . Penicillins Hives   Subective: Patient presents for session, sharing progress.  He stated that he and his wife were able to go on a date last night they attended a local a local theatrical play.  He stated that he and his wife enjoyed the event sharing some experiences.  He stated that he had a recent conversation with his mother where she had received a post card and gift from one of  his friends who is traveling currently.  He says she sent the gift which left him feeling some concern.  He stated that he come to learn that the woman with which he had an affair last year also with her on the trip as they are close friends.  He stated that he has had boundaries in the relationship with his friend of many years due to her being close friends with her.  He identified the thought "I just found it strange that she is not engaged, I wonder if she is behind it" (referring to the woman with which she had the affair).  Facilitated his further identifying and verbalizing how he feels he can effectively handle his thoughts and feelings where he stated that he plans not to "focus on it".  He said he was able to talk to his parents and they plan to discard the lift.  He stated that he continues to strive to be open and honest in his marital relationship with his wife but is not seeing the need currently to make her aware of the situation as he knows that it is also speculative on his part.  We assisted him in identifying on his choice not to focus on it and how he feels this awareness can possibly decrease his feelings of anxiety.   Interventions: CBT, supportive therapy  Diagnoses:    ICD-10-CM   1. Adjustment disorder with mixed anxiety and depressed mood  F43.23  Plan: Patient is to use CBT, mindfulness and coping skills to help manage decrease symptoms associated with their diagnosis.    Patient to continue to work on his more open communication in his marital relationship.   Long-term goal:   Reduce overall level, frequency, and intensity of the feelings of depression and anxiety 6-7/10 to a 0-2/10 in severity for at least 3 consecutive months.  Short-term goal:  Decrease "deflating, self-doubting" and catastrophizing thinking style Decrease feelings of guilt  Continue to improve his marital relationship through affective communication Utilize coping as discussed in session    Assessment of progress:  progressing  Waldron Session, [facility]

## 2020-05-18 ENCOUNTER — Ambulatory Visit: Payer: 59 | Admitting: Mental Health

## 2020-07-13 ENCOUNTER — Other Ambulatory Visit: Payer: Self-pay

## 2020-07-13 ENCOUNTER — Ambulatory Visit (INDEPENDENT_AMBULATORY_CARE_PROVIDER_SITE_OTHER): Payer: 59 | Admitting: Mental Health

## 2020-07-13 DIAGNOSIS — F4323 Adjustment disorder with mixed anxiety and depressed mood: Secondary | ICD-10-CM | POA: Diagnosis not present

## 2020-07-13 NOTE — Progress Notes (Signed)
[facility] Psychotherapy Note  Name: Steven Gregory Date: 07/13/2020 MRN: [id number] DOB: [birth year] PCP: Patient, No Pcp Per  Time spent: 54 minutes   Treatment:   Individual therapy  Mental Status Exam:   Appearance:   Casual     Behavior:  Appropriate  Motor:  Normal  Speech/Language:   Clear and Coherent  Affect:  Full Range  Mood:   Anxious, sad, pleasant  Thought process:  normal  Thought content:    WNL  Sensory/Perceptual disturbances:    WNL  Orientation:  x4  Attention:  Good  Concentration:  Good  Memory:  WNL  Fund of knowledge:   Good  Insight:    Good  Judgment:   Good  Impulse Control:  Good   Reported Symptoms:   Anxiety, depressed mood, some sleep deficits, rumination at times  Risk Assessment: Danger to Self:  No Self-injurious Behavior: No Danger to Others: No Duty to Warn:no Physical Aggression / Violence:No  Access to Firearms a concern: No  Gang Involvement:no  Patient / guardian was educated about steps to take if suicide or homicide risk level increases between visits: yes While future psychiatric events cannot be accurately predicted, the patient does not currently require acute inpatient psychiatric care and does not currently meet [facility] involuntary commitment criteria.  Medications: Current Outpatient Medications  Medication Sig Dispense Refill  . amphetamine-dextroamphetamine (ADDERALL XR) 20 MG 24 hr capsule Take 20 mg by mouth daily.     No current facility-administered medications for this visit.    Allergies  Allergen Reactions  . Codeine   . Penicillins Hives   Subective: Patient presents for session, sharing progress.  He stated that he wanted to return to treatment sooner than today but due to his demanding work schedule he was unable.  He shared the many challenges with work over the past 1 to 2 months due to the increased demands of COVID infections increasing.  Patient stated that his job has been  overwhelming often due to the increased demands in the community and having limited resources.  Facilitated his identifying and processing thoughts and feelings related to the increased stress.  He reports his marital relationship is going well, there is highly supportive of 1 another as they both have had increased stress at her jobs.  Through guided discovery, he identified feelings of sadness and anxiety related to the significantly increased stress at work.  He identified also the need for self-care but he continues to adhere to his exercise regimen with consistency as well as also considering employment changes in the coming months if work conditions do not improve.  Interventions: CBT, supportive therapy  Diagnoses:    ICD-10-CM   1. Adjustment disorder with mixed anxiety and depressed mood  F43.23      Plan: Patient is to use CBT, mindfulness and coping skills to help manage decrease symptoms associated with their diagnosis.    Patient to continue to work on his more open communication in his marital relationship.   Long-term goal:   Reduce overall level, frequency, and intensity of the feelings of depression and anxiety 6-7/10 to a 0-2/10 in severity for at least 3 consecutive months.  Short-term goal:  Decrease "deflating, self-doubting" and catastrophizing thinking style Decrease feelings of guilt  Continue to improve his marital relationship through affective communication Utilize coping as discussed in session   Assessment of progress:  progressing  Waldron Session, [facility]

## 2020-08-04 ENCOUNTER — Other Ambulatory Visit: Payer: Self-pay

## 2020-08-04 ENCOUNTER — Ambulatory Visit (INDEPENDENT_AMBULATORY_CARE_PROVIDER_SITE_OTHER): Payer: 59 | Admitting: Mental Health

## 2020-08-04 DIAGNOSIS — F4323 Adjustment disorder with mixed anxiety and depressed mood: Secondary | ICD-10-CM | POA: Diagnosis not present
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
## 2020-08-15 ENCOUNTER — Ambulatory Visit: Payer: 59 | Admitting: Mental Health

## 2020-08-29 ENCOUNTER — Ambulatory Visit (INDEPENDENT_AMBULATORY_CARE_PROVIDER_SITE_OTHER): Payer: 59 | Admitting: Mental Health

## 2020-08-29 ENCOUNTER — Other Ambulatory Visit: Payer: Self-pay

## 2020-08-29 DIAGNOSIS — F4323 Adjustment disorder with mixed anxiety and depressed mood: Secondary | ICD-10-CM | POA: Diagnosis not present
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
## 2020-12-28 ENCOUNTER — Ambulatory Visit
Admission: EM | Admit: 2020-12-28 | Discharge: 2020-12-28 | Disposition: A | Discharge status: Home / Self Care | Payer: 59 | Attending: Emergency Medicine | Admitting: Emergency Medicine

## 2020-12-28 ENCOUNTER — Other Ambulatory Visit: Payer: Self-pay

## 2020-12-28 ENCOUNTER — Encounter: Payer: Self-pay | Admitting: Emergency Medicine

## 2020-12-28 DIAGNOSIS — M545 Low back pain, unspecified: Secondary | ICD-10-CM

## 2020-12-28 MED ORDER — CYCLOBENZAPRINE HCL 10 MG PO TABS: 10.0000 mg | ORAL_TABLET | Freq: Two times a day (BID) | ORAL | 0 refills | Status: DC | PRN

## 2020-12-28 MED ORDER — KETOROLAC TROMETHAMINE 60 MG/2ML IM SOLN
60.0000 mg | Freq: Once | INTRAMUSCULAR | Status: AC
  Administered 2020-12-28: 60 mg via INTRAMUSCULAR

## 2020-12-28 MED ORDER — DEXAMETHASONE SODIUM PHOSPHATE 10 MG/ML IJ SOLN
10.0000 mg | Freq: Once | INTRAMUSCULAR | Status: AC
  Administered 2020-12-28: 10 mg via INTRAMUSCULAR

## 2020-12-28 MED ORDER — PREDNISONE 20 MG PO TABS: 20.0000 mg | ORAL_TABLET | Freq: Two times a day (BID) | ORAL | 0 refills | Status: AC

## 2020-12-28 NOTE — ED Triage Notes (Signed)
Lower LT sided back pain that started Tuesday night.

## 2020-12-28 NOTE — Discharge Instructions (Addendum)
Continue conservative management of rest, ice, and gentle stretches Prednisone prescribed.  Use as directed and to completion Follow up with PCP if symptoms persist Return or go to the ER if you have any new or worsening symptoms (fever, chills, chest pain, abdominal pain, changes in bowel or bladder habits, pain radiating into lower legs, etc...)

## 2020-12-28 NOTE — ED Provider Notes (Signed)
[facility]   [id number] 12/28/20 Arrival Time: 1232  CC: Back PAIN  SUBJECTIVE: History from: patient. Steven Gregory is a 35 y.o. male complains of LT low back x 2 days.  Denies a precipitating event or specific injury, speculates he may have done it while sleeping.  Localizes the pain to the LT low back.  Describes the pain as intermittent and sharp  in character.  Has tried OTC medications without relief.  Symptoms are made worse with movement.  Denies fever, chills, erythema, ecchymosis, effusion, weakness, numbness and tingling, saddle paresthesias, loss of bowel or bladder function.      ROS: As per HPI.  All other pertinent ROS negative.     History reviewed. No pertinent past medical history. Past Surgical History:  Procedure Laterality Date   ANTERIOR CRUCIATE LIGAMENT REPAIR Right    Allergies  Allergen Reactions   Penicillins Hives   No current facility-administered medications on file prior to encounter.   Current Outpatient Medications on File Prior to Encounter  Medication Sig Dispense Refill   amphetamine-dextroamphetamine (ADDERALL XR) 20 MG 24 hr capsule Take 20 mg by mouth daily.     Social History   Socioeconomic History   Marital status: Married    Spouse name: Not on file   Number of children: Not on file   Years of education: Not on file   Highest education level: Not on file  Occupational History   Not on file  Tobacco Use   Smoking status: Never   Smokeless tobacco: Current    Types: Chew  Substance and Sexual Activity   Alcohol use: Yes    Comment: socially   Drug use: Never   Sexual activity: Not on file  Other Topics Concern   Not on file  Social History Narrative   Not on file   Social Determinants of Health   Financial Resource Strain: Not on file  Food Insecurity: Not on file  Transportation Needs: Not on file  Physical Activity: Not on file  Stress: Not on file  Social Connections: Not on file  Intimate Partner  Violence: Not on file   Family History  Problem Relation Age of Onset   Kidney Stones Mother     OBJECTIVE:  Vitals:   12/28/20 1247  BP: 128/82  Pulse: 100  Resp: 19  Temp: 98.5 F (36.9 C)  TempSrc: Oral  SpO2: 96%    General appearance: ALERT; in no acute distress.  Head: NCAT Lungs: Normal respiratory effort Musculoskeletal: Back  Inspection: Skin warm, dry, clear and intact without obvious erythema, effusion, or ecchymosis.  Palpation: diffuse TTP over LT low back ROM: FROM active and passive Strength: 5/5 hip flexion, 5/5 hip extension Skin: warm and dry Neurologic: Ambulates without difficulty; Sensation intact about the lower extremities Psychological: alert and cooperative; normal mood and affect  ASSESSMENT & PLAN:  1. Acute left-sided low back pain without sciatica    Meds ordered this encounter  Medications   ketorolac (TORADOL) injection 60 mg   dexamethasone (DECADRON) injection 10 mg   predniSONE (DELTASONE) 20 MG tablet    Sig: Take 1 tablet (20 mg total) by mouth 2 (two) times daily with a meal for 5 days.    Dispense:  10 tablet    Refill:  0    Order Specific Question:   Supervising Provider    Answer:   Eustace Moore [[id number]]   Steroid and toradol shots given in office Continue conservative management of rest,  ice, and gentle stretches Prednisone prescribed.  Use as directed and to completion Follow up with PCP if symptoms persist Return or go to the ER if you have any new or worsening symptoms (fever, chills, chest pain, abdominal pain, changes in bowel or bladder habits, pain radiating into lower legs, etc...)   Reviewed expectations re: course of current medical issues. Questions answered. Outlined signs and symptoms indicating need for more acute intervention. Patient verbalized understanding. After Visit Summary given.     Rennis Harding, PA-C 12/28/20 1302

## 2021-01-24 ENCOUNTER — Ambulatory Visit: Payer: 59 | Admitting: Mental Health

## 2021-01-24 ENCOUNTER — Other Ambulatory Visit: Payer: Self-pay

## 2021-01-24 DIAGNOSIS — F4323 Adjustment disorder with mixed anxiety and depressed mood: Secondary | ICD-10-CM | POA: Diagnosis not present
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
## 2021-04-03 IMAGING — DX DG FOOT COMPLETE 3+V*L*
3 series · 3 of 3 positions shown · non-contrast
Comparison: None.

CLINICAL DATA: Acute on chronic pain, left foot pain for 5-6 weeks

EXAM:
LEFT FOOT - COMPLETE 3+ VIEW

[foot supine dp]
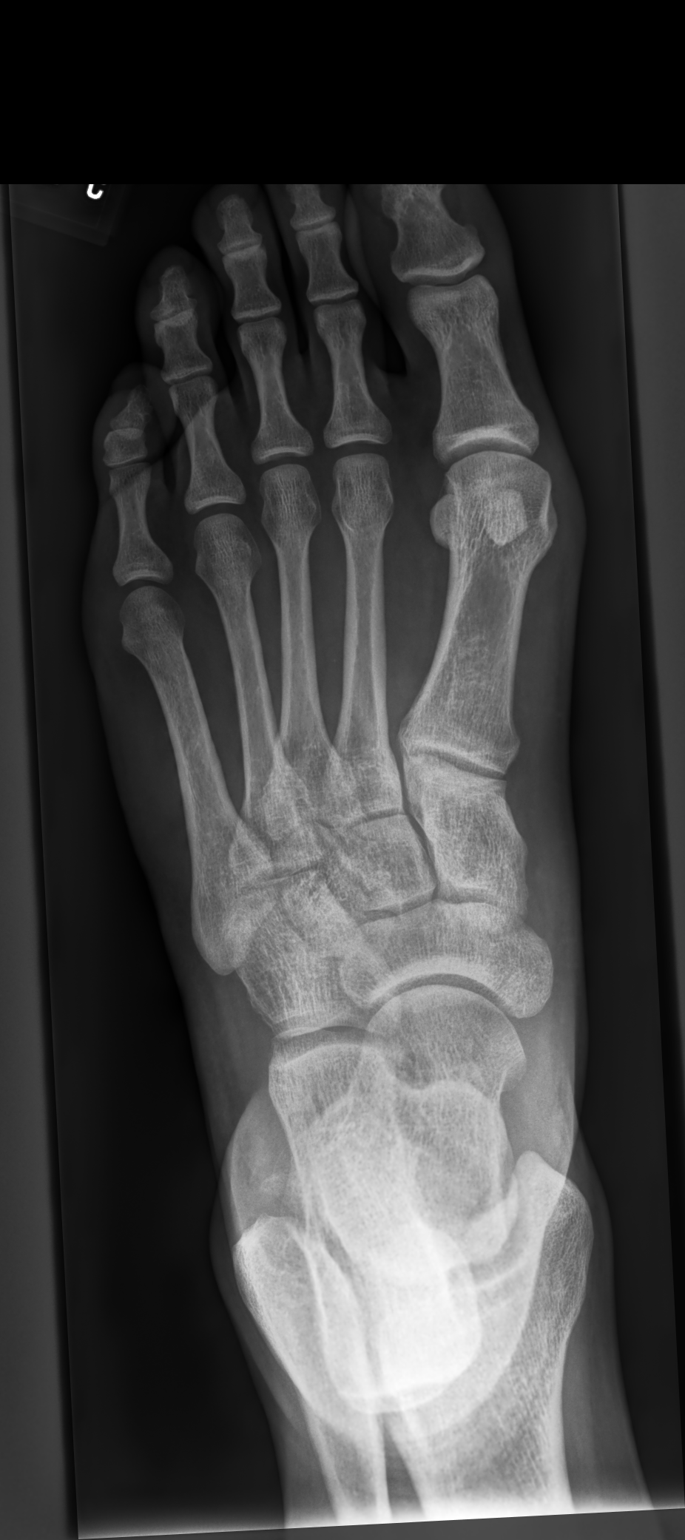

[foot medial oblique]
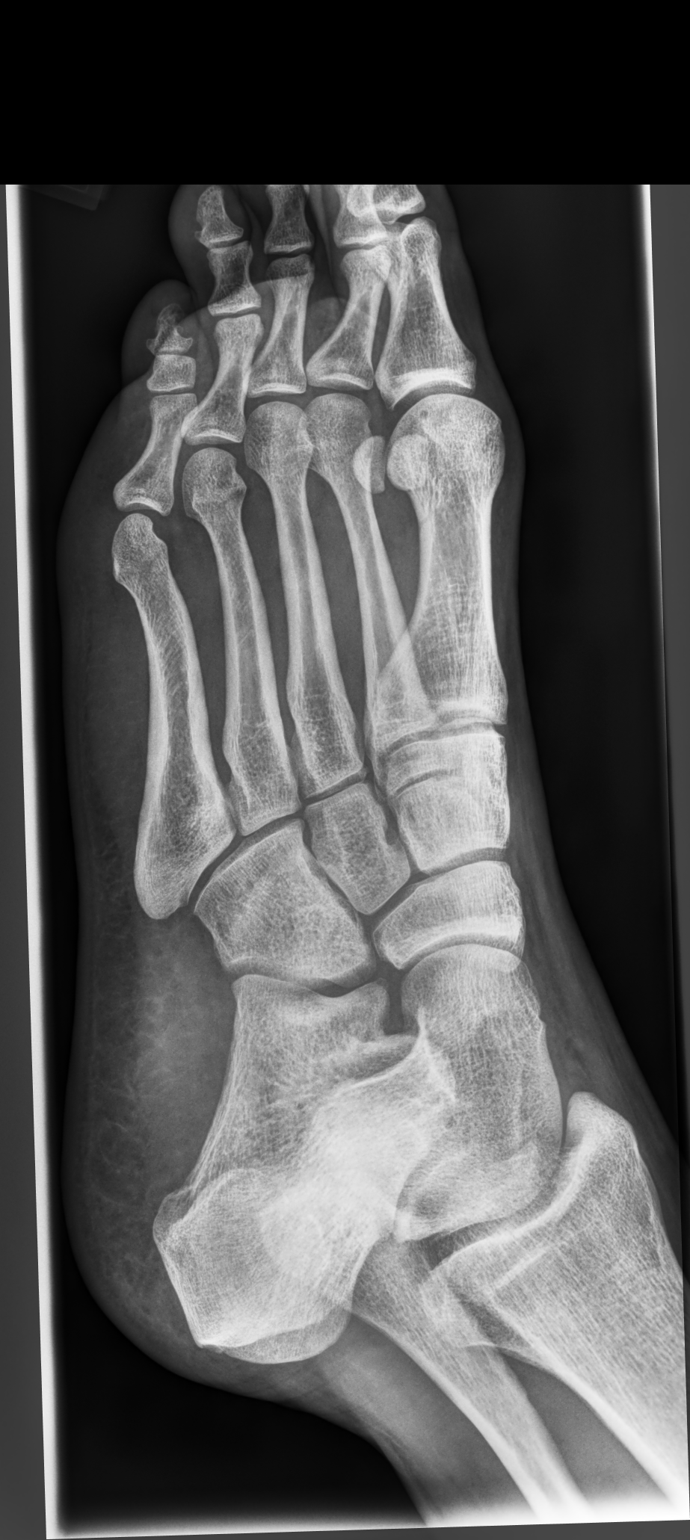

[foot supine lat]
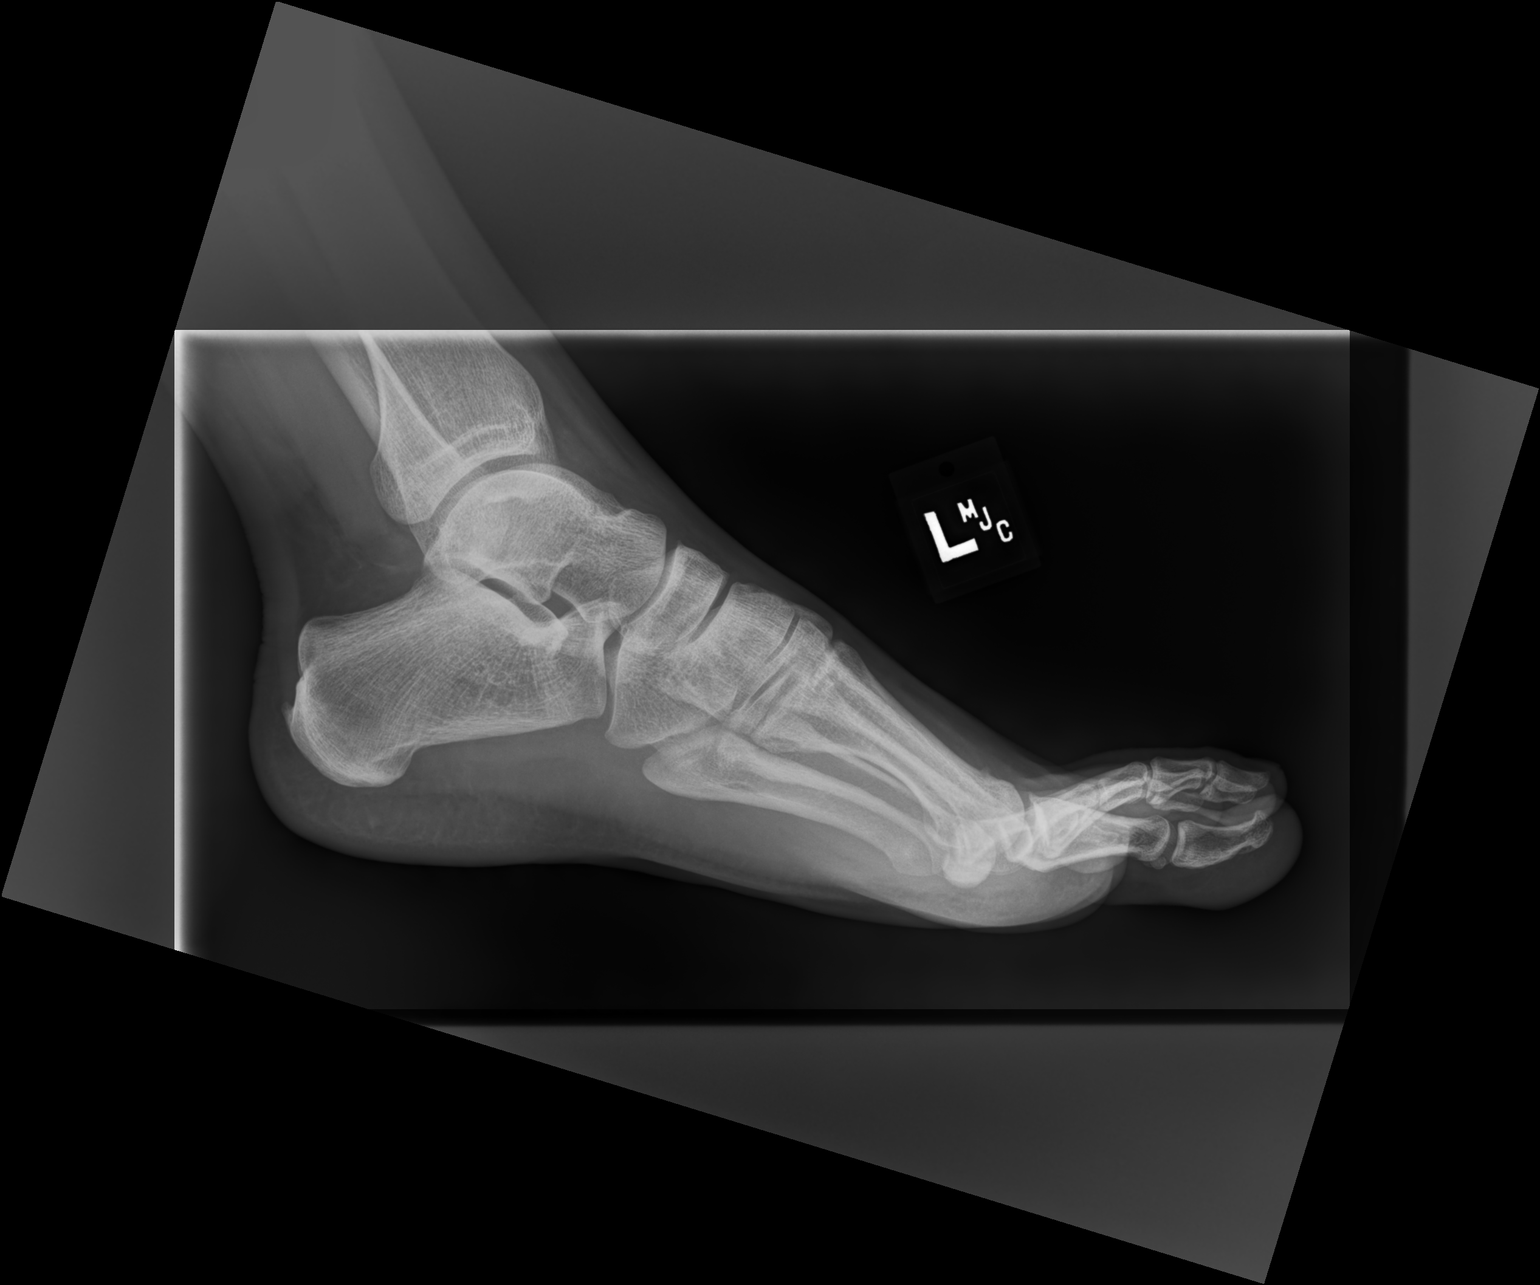

[3 of 3 positions shown; findings below may reference images not displayed]

FINDINGS: Frontal, oblique, lateral views of the left foot are obtained. No
fracture, subluxation, or dislocation. Joint spaces are well
preserved. Soft tissues are normal.
IMPRESSION: 1. Unremarkable left foot.

## 2021-06-28 ENCOUNTER — Ambulatory Visit: Payer: 59 | Admitting: Mental Health

## 2022-03-17 ENCOUNTER — Ambulatory Visit (INDEPENDENT_AMBULATORY_CARE_PROVIDER_SITE_OTHER): Payer: No Typology Code available for payment source

## 2022-03-17 ENCOUNTER — Ambulatory Visit
Admission: EM | Admit: 2022-03-17 | Discharge: 2022-03-17 | Disposition: A | Discharge status: Home / Self Care | Payer: No Typology Code available for payment source | Attending: Emergency Medicine | Admitting: Emergency Medicine

## 2022-03-17 DIAGNOSIS — J208 Acute bronchitis due to other specified organisms: Secondary | ICD-10-CM

## 2022-03-17 DIAGNOSIS — R059 Cough, unspecified: Secondary | ICD-10-CM

## 2022-03-17 DIAGNOSIS — B9689 Other specified bacterial agents as the cause of diseases classified elsewhere: Secondary | ICD-10-CM

## 2022-03-17 MED ORDER — AZITHROMYCIN 250 MG PO TABS: ORAL_TABLET | ORAL | 0 refills | Status: AC

## 2022-03-17 MED ORDER — PROMETHAZINE-DM 6.25-15 MG/5ML PO SYRP: 5.0000 mL | ORAL_SOLUTION | Freq: Four times a day (QID) | ORAL | 0 refills | Status: AC | PRN

## 2022-03-17 MED ORDER — ALBUTEROL SULFATE HFA 108 (90 BASE) MCG/ACT IN AERS: 2.0000 | INHALATION_SPRAY | Freq: Four times a day (QID) | RESPIRATORY_TRACT | 1 refills | Status: AC | PRN

## 2022-03-17 MED ORDER — METHYLPREDNISOLONE 8 MG PO TABS: 8.0000 mg | ORAL_TABLET | Freq: Every day | ORAL | 0 refills | Status: AC

## 2022-03-17 MED ORDER — GUAIFENESIN 400 MG PO TABS: ORAL_TABLET | ORAL | 0 refills | Status: AC

## 2022-03-17 NOTE — ED Triage Notes (Signed)
C/O cough with clear to greenish sputum for 2 weeks. Pt states that after running 3 miles he coughed up a lot of greens sputum.

## 2022-03-17 NOTE — ED Provider Notes (Signed)
[facility]    CSN: [id number] Arrival date & time: 03/17/22  8841    HISTORY   Chief Complaint  Patient presents with   Cough   HPI Steven Gregory is a pleasant, 36 y.o. male who presents to urgent care today. Patient complains of a cough productive of green sputum for the past few weeks.  Patient states he runs about 10 miles a week, does yoga, does not smoke.  Patient denies a history of allergies and asthma.  Patient states he has a child in first grade.  Patient reports recently traveling to [place] with his family, states his father has the exact same symptoms that he does.  Patient is requesting checks x-ray today.  Patient states initially he had a headache when this illness began, denies nasal congestion, postnasal drip, sore throat, ear pain, nausea, vomiting, diarrhea, fever, body aches, chills, loss of taste or smell.  The history is provided by the patient.   History reviewed. No pertinent past medical history. Patient Active Problem List   Diagnosis Date Noted   PALPITATIONS 10/03/2009   Past Surgical History:  Procedure Laterality Date   ANTERIOR CRUCIATE LIGAMENT REPAIR Right     Home Medications    Prior to Admission medications   Not on File    Family History Family History  Problem Relation Age of Onset   Kidney Stones Mother    Social History Social History   Tobacco Use   Smoking status: Never   Smokeless tobacco: Current    Types: Chew  Vaping Use   Vaping Use: Never used  Substance Use Topics   Alcohol use: Yes    Comment: socially   Drug use: Never   Allergies   Penicillins  Review of Systems Review of Systems Pertinent findings revealed after performing a 14 point review of systems has been noted in the history of present illness.  Physical Exam Triage Vital Signs ED Triage Vitals  Enc Vitals Group     BP 05/04/21 0827 (!) 147/82     Pulse Rate 05/04/21 0827 72     Resp 05/04/21 0827 18     Temp 05/04/21  0827 98.3 F (36.8 C)     Temp Source 05/04/21 0827 Oral     SpO2 05/04/21 0827 98 %     Weight --      Height --      Head Circumference --      Peak Flow --      Pain Score 05/04/21 0826 5     Pain Loc --      Pain Edu? --      Excl. in GC? --   No data found.  Updated Vital Signs BP 123/82   Pulse 65   Temp 98.5 F (36.9 C) (Oral)   Resp 18   SpO2 94%   Physical Exam Vitals and nursing note reviewed.  Constitutional:      General: He is not in acute distress.    Appearance: Normal appearance. He is not ill-appearing.  HENT:     Head: Normocephalic and atraumatic.     Salivary Glands: Right salivary gland is not diffusely enlarged or tender. Left salivary gland is not diffusely enlarged or tender.     Right Ear: Tympanic membrane, ear canal and external ear normal. No drainage. No middle ear effusion. There is no impacted cerumen. Tympanic membrane is not erythematous or bulging.     Left Ear: Tympanic membrane, ear canal and external  ear normal. No drainage.  No middle ear effusion. There is no impacted cerumen. Tympanic membrane is not erythematous or bulging.     Nose: Nose normal. No nasal deformity, septal deviation, mucosal edema, congestion or rhinorrhea.     Right Turbinates: Not enlarged, swollen or pale.     Left Turbinates: Not enlarged, swollen or pale.     Right Sinus: No maxillary sinus tenderness or frontal sinus tenderness.     Left Sinus: No maxillary sinus tenderness or frontal sinus tenderness.     Mouth/Throat:     Lips: Pink. No lesions.     Mouth: Mucous membranes are moist. No oral lesions.     Pharynx: Oropharynx is clear. Uvula midline. No posterior oropharyngeal erythema or uvula swelling.     Tonsils: No tonsillar exudate. 0 on the right. 0 on the left.  Eyes:     General: Lids are normal.        Right eye: No discharge.        Left eye: No discharge.     Extraocular Movements: Extraocular movements intact.     Conjunctiva/sclera:  Conjunctivae normal.     Right eye: Right conjunctiva is not injected.     Left eye: Left conjunctiva is not injected.  Neck:     Trachea: Trachea and phonation normal.  Cardiovascular:     Rate and Rhythm: Normal rate and regular rhythm.     Pulses: Normal pulses.     Heart sounds: Normal heart sounds. No murmur heard.    No friction rub. No gallop.  Pulmonary:     Effort: Pulmonary effort is normal. No tachypnea, bradypnea, accessory muscle usage, prolonged expiration, respiratory distress or retractions.     Breath sounds: Normal air entry. No stridor, decreased air movement or transmitted upper airway sounds. Examination of the right-upper field reveals decreased breath sounds. Examination of the left-upper field reveals decreased breath sounds. Examination of the right-middle field reveals decreased breath sounds. Examination of the left-middle field reveals decreased breath sounds. Examination of the right-lower field reveals decreased breath sounds. Examination of the left-lower field reveals decreased breath sounds. Decreased breath sounds present. No wheezing, rhonchi or rales.     Comments: Bronchospasm with cough, breath sounds coarse throughout Chest:     Chest wall: No tenderness.  Musculoskeletal:        General: Normal range of motion.     Cervical back: Normal range of motion and neck supple. Normal range of motion.  Lymphadenopathy:     Cervical: No cervical adenopathy.  Skin:    General: Skin is warm and dry.     Findings: No erythema or rash.  Neurological:     General: No focal deficit present.     Mental Status: He is alert and oriented to person, place, and time.  Psychiatric:        Mood and Affect: Mood normal.        Behavior: Behavior normal.     Visual Acuity Right Eye Distance:   Left Eye Distance:   Bilateral Distance:    Right Eye Near:   Left Eye Near:    Bilateral Near:     [facility] Couse / Diagnostics / Procedures:     Radiology DG Chest 2  View  Result Date: 03/17/2022 CLINICAL DATA:  Productive cough 2 weeks. EXAM: CHEST - 2 VIEW COMPARISON:  07/12/2018 FINDINGS: Lungs are adequately inflated and otherwise clear. Cardiomediastinal silhouette, bones and soft tissues are normal. IMPRESSION: No active cardiopulmonary  disease. Electronically Signed   By: Elberta Fortis M.D.   On: 03/17/2022 09:57    Procedures Procedures (including critical care time) EKG  Pending results:  Labs Reviewed - No data to display  Medications Ordered in UC: Medications - No data to display  UC Diagnoses / Final Clinical Impressions(s)   I have reviewed the triage vital signs and the nursing notes.  Pertinent labs & imaging results that were available during my care of the patient were reviewed by me and considered in my medical decision making (see chart for details).    Final diagnoses:  Acute bacterial bronchitis   Patient provided with azithromycin for probable bacterial bronchitis.  Prednisone, albuterol, guaifenesin and Promethazine DM provided for cough.  ED Prescriptions     Medication Sig Dispense Auth. Provider   azithromycin (ZITHROMAX) 250 MG tablet Take 2 tablets (500 mg total) by mouth daily for 1 day, THEN 1 tablet (250 mg total) daily for 4 days. 6 tablet Theadora Rama Scales, PA-C   methylPREDNISolone (MEDROL) 8 MG tablet Take 1 tablet (8 mg total) by mouth daily for 5 days. 5 tablet Theadora Rama Scales, PA-C   promethazine-dextromethorphan (PROMETHAZINE-DM) 6.25-15 MG/5ML syrup Take 5 mLs by mouth 4 (four) times daily as needed for cough. 118 mL Theadora Rama Scales, PA-C   guaifenesin (HUMIBID E) 400 MG TABS tablet Take 1 tablet 3 times daily as needed for chest congestion and cough 21 tablet Theadora Rama Scales, PA-C   albuterol (VENTOLIN HFA) 108 (90 Base) MCG/ACT inhaler Inhale 2 puffs into the lungs every 6 (six) hours as needed for wheezing or shortness of breath (Cough). 18 g Theadora Rama Scales, PA-C       PDMP not reviewed this encounter.  Disposition Upon Discharge:  Condition: stable for discharge home Home: take medications as prescribed; routine discharge instructions as discussed; follow up as advised.  Patient presented with an acute illness with associated systemic symptoms and significant discomfort requiring urgent management. In my opinion, this is a condition that a prudent lay person (someone who possesses an average knowledge of health and medicine) may potentially expect to result in complications if not addressed urgently such as respiratory distress, impairment of bodily function or dysfunction of bodily organs.   Routine symptom specific, illness specific and/or disease specific instructions were discussed with the patient and/or caregiver at length.   As such, the patient has been evaluated and assessed, work-up was performed and treatment was provided in alignment with urgent care protocols and evidence based medicine.  Patient/parent/caregiver has been advised that the patient may require follow up for further testing and treatment if the symptoms continue in spite of treatment, as clinically indicated and appropriate.  If the patient was tested for COVID-19, Influenza and/or RSV, then the patient/parent/guardian was advised to isolate at home pending the results of his/her diagnostic coronavirus test and potentially longer if they're positive. I have also advised pt that if his/her COVID-19 test returns positive, it's recommended to self-isolate for at least 10 days after symptoms first appeared AND until fever-free for 24 hours without fever reducer AND other symptoms have improved or resolved. Discussed self-isolation recommendations as well as instructions for household member/close contacts as per the [facility] and [place] DHHS, and also gave patient the COVID packet with this information.  Patient/parent/caregiver has been advised to return to the [facility] or PCP in 3-5 days if no better;  to PCP or the Emergency Department if new signs and symptoms develop, or if the  current signs or symptoms continue to change or worsen for further workup, evaluation and treatment as clinically indicated and appropriate  The patient will follow up with their current PCP if and as advised. If the patient does not currently have a PCP we will assist them in obtaining one.   The patient may need specialty follow up if the symptoms continue, in spite of conservative treatment and management, for further workup, evaluation, consultation and treatment as clinically indicated and appropriate.  Patient/parent/caregiver verbalized understanding and agreement of plan as discussed.  All questions were addressed during visit.  Please see discharge instructions below for further details of plan.  Discharge Instructions:   Discharge Instructions      Your chest x-ray was not concerning for pneumonia or pleural effusion.  Please see the list below for recommended medications, dosages and frequencies to provide relief of your current symptoms caused by what is likely bacterial bronchitis:    Z-Pak (azithromycin): To eradicate bacteria which is prolonging your cough and causing you to have green sputum, please take 2 tablets the first day, then take 1 tablet daily every day thereafter until complete.   Medrol (methylprednisolone): This is a steroid that will significantly calm your upper and lower airways, please take 1 tablet daily.  I provided you with 5 tablets but if you feel significantly better after 3 days, you can discontinue.      ProAir, Ventolin, Proventil (albuterol): This inhaled medication contains a short acting beta agonist bronchodilator.  This medication works on the smooth muscle that opens and constricts of your airways by relaxing the muscle.  The result of relaxation of the smooth muscle is increased air movement and improved work of breathing.  This is a short acting medication that can  be used every 4-6 hours as needed for increased work of breathing, shortness of breath, wheezing and excessive coughing.   Robitussin, Mucinex (guaifenesin): This is an expectorant.  This helps break up chest congestion and loosen up thick nasal drainage making phlegm and drainage more liquid and therefore easier to remove.  I recommend being 400 mg three times daily as needed.     Promethazine DM: Promethazine is both a nasal decongestant and an antinausea medication that makes most patients feel fairly sleepy.  The DM is dextromethorphan, a cough suppressant found in many over-the-counter cough medications.  Please take 5 mL before bedtime to minimize your cough which will help you sleep better.  I have sent a prescription for this medication to your pharmacy.   If you have not had meaningful improvement of your symptoms in the next 5 to 7 days, please follow-up with your primary care provider or return to urgent care for further evaluation and treatment.  Thank you for visiting urgent care today.       This office note has been dictated using Teaching laboratory technician.  Unfortunately, this method of dictation can sometimes lead to typographical or grammatical errors.  I apologize for your inconvenience in advance if this occurs.  Please do not hesitate to reach out to me if clarification is needed.      Theadora Rama Scales, PA-C 03/17/22 1019

## 2022-03-17 NOTE — Discharge Instructions (Addendum)
Your chest x-ray was not concerning for pneumonia or pleural effusion.  Please see the list below for recommended medications, dosages and frequencies to provide relief of your current symptoms caused by what is likely bacterial bronchitis:    Z-Pak (azithromycin): To eradicate bacteria which is prolonging your cough and causing you to have green sputum, please take 2 tablets the first day, then take 1 tablet daily every day thereafter until complete.   Medrol (methylprednisolone): This is a steroid that will significantly calm your upper and lower airways, please take 1 tablet daily.  I provided you with 5 tablets but if you feel significantly better after 3 days, you can discontinue.      ProAir, Ventolin, Proventil (albuterol): This inhaled medication contains a short acting beta agonist bronchodilator.  This medication works on the smooth muscle that opens and constricts of your airways by relaxing the muscle.  The result of relaxation of the smooth muscle is increased air movement and improved work of breathing.  This is a short acting medication that can be used every 4-6 hours as needed for increased work of breathing, shortness of breath, wheezing and excessive coughing.   Robitussin, Mucinex (guaifenesin): This is an expectorant.  This helps break up chest congestion and loosen up thick nasal drainage making phlegm and drainage more liquid and therefore easier to remove.  I recommend being 400 mg three times daily as needed.     Promethazine DM: Promethazine is both a nasal decongestant and an antinausea medication that makes most patients feel fairly sleepy.  The DM is dextromethorphan, a cough suppressant found in many over-the-counter cough medications.  Please take 5 mL before bedtime to minimize your cough which will help you sleep better.  I have sent a prescription for this medication to your pharmacy.   If you have not had meaningful improvement of your symptoms in the next 5 to 7  days, please follow-up with your primary care provider or return to urgent care for further evaluation and treatment.  Thank you for visiting urgent care today.

## 2022-05-13 ENCOUNTER — Ambulatory Visit (INDEPENDENT_AMBULATORY_CARE_PROVIDER_SITE_OTHER): Payer: No Typology Code available for payment source | Admitting: Mental Health

## 2022-05-13 DIAGNOSIS — F4323 Adjustment disorder with mixed anxiety and depressed mood: Secondary | ICD-10-CM | POA: Diagnosis not present

## 2022-05-13 NOTE — Progress Notes (Signed)
[facility] Psychotherapy Note  Name: Steven Gregory Date: 05/13/22 MRN: [id number] DOB: 01-May-1986 PCP: Patient, No Pcp Per  Time spent: 55 minutes   Treatment:   Individual therapy  Mental Status Exam:    Appearance:   Casual     Behavior:  Appropriate  Motor:  Normal  Speech/Language:   Clear and Coherent  Affect:  Full Range  Mood:   Euthymic  Thought process:  normal  Thought content:    WNL  Sensory/Perceptual disturbances:    WNL  Orientation:  x4  Attention:  Good  Concentration:  Good  Memory:  WNL  Fund of knowledge:   Good  Insight:    Good  Judgment:   Good  Impulse Control:  Good   Reported Symptoms:   Anxiety, depressed mood, some sleep deficits, rumination at times  Risk Assessment: Danger to Self:  No Self-injurious Behavior: No Danger to Others: No Duty to Warn:no Physical Aggression / Violence:No  Access to Firearms a concern: No  Gang Involvement:no  Patient / guardian was educated about steps to take if suicide or homicide risk level increases between visits: yes While future psychiatric events cannot be accurately predicted, the patient does not currently require acute inpatient psychiatric care and does not currently meet [facility] involuntary commitment criteria.  Medications: Current Outpatient Medications  Medication Sig Dispense Refill   albuterol (VENTOLIN HFA) 108 (90 Base) MCG/ACT inhaler Inhale 2 puffs into the lungs every 6 (six) hours as needed for wheezing or shortness of breath (Cough). 18 g 1   guaifenesin (HUMIBID E) 400 MG TABS tablet Take 1 tablet 3 times daily as needed for chest congestion and cough 21 tablet 0   promethazine-dextromethorphan (PROMETHAZINE-DM) 6.25-15 MG/5ML syrup Take 5 mLs by mouth 4 (four) times daily as needed for cough. 118 mL 0   No current facility-administered medications for this visit.    Allergies  Allergen Reactions   Amoxicillin Rash   Subective:  Patient arrived on time  for today's session.  Assessed progress and relevant recent events since last visit which was approximately 1 year ago. 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 He shared benefits of making this change, daily stress has been reduced and is typically getting more restful sleep. 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 Presently, he's noticed that it varying instances he may have a increased startle response at times when reminded of any past incident. Encouraged him to identify feelings that surface during these incidents and work on journaling which in part will take place through documenting experiences as he is working on Estate agent a book.      Interventions: CBT, supportive therapy, problem solving  Diagnoses:    ICD-10-CM   1. Adjustment disorder with mixed anxiety and depressed mood  F43.23         Plan: Patient is to use CBT, mindfulness and coping skills to help manage decrease symptoms associated with their diagnosis.    Patient to continue to work on his more open communication in his relationships.  Patient to restart working out again as it is one of his outlets for stress reduction.  Long-term goal:   Reduce overall level, frequency, and intensity of the feelings of depression and anxiety 6-7/10 to a 0-2/10 in severity for at least 3 consecutive months.  Short-term goal:  Decrease "deflating, self-doubting" and catastrophizing thinking style Decrease feelings of guilt  Continue to improve his marital relationship through affective communication Utilize coping as discussed in session   Assessment of progress:  progressing  Waldron Session, [facility]

## 2022-06-13 ENCOUNTER — Ambulatory Visit: Payer: No Typology Code available for payment source | Admitting: Mental Health

## 2022-06-13 DIAGNOSIS — F4323 Adjustment disorder with mixed anxiety and depressed mood: Secondary | ICD-10-CM | POA: Diagnosis not present

## 2022-07-04 NOTE — Progress Notes (Signed)
[facility] Psychotherapy Note  Name: Steven Gregory Date: 06/13/22 MRN: [id number] DOB: 08-24-1985 PCP: Patient, No Pcp Per  Time spent: 50 minutes   Treatment:   Individual therapy  Mental Status Exam:    Appearance:   Casual     Behavior:  Appropriate  Motor:  Normal  Speech/Language:   Clear and Coherent  Affect:  Full Range  Mood:   Euthymic  Thought process:  normal  Thought content:    WNL  Sensory/Perceptual disturbances:    WNL  Orientation:  x4  Attention:  Good  Concentration:  Good  Memory:  WNL  Fund of knowledge:   Good  Insight:    Good  Judgment:   Good  Impulse Control:  Good   Reported Symptoms:   Anxiety, depressed mood, some sleep deficits, rumination at times  Risk Assessment: Danger to Self:  No Self-injurious Behavior: No Danger to Others: No Duty to Warn:no Physical Aggression / Violence:No  Access to Firearms a concern: No  Gang Involvement:no  Patient / guardian was educated about steps to take if suicide or homicide risk level increases between visits: yes While future psychiatric events cannot be accurately predicted, the patient does not currently require acute inpatient psychiatric care and does not currently meet [facility] involuntary commitment criteria.  Medications: Current Outpatient Medications  Medication Sig Dispense Refill   albuterol (VENTOLIN HFA) 108 (90 Base) MCG/ACT inhaler Inhale 2 puffs into the lungs every 6 (six) hours as needed for wheezing or shortness of breath (Cough). 18 g 1   guaifenesin (HUMIBID E) 400 MG TABS tablet Take 1 tablet 3 times daily as needed for chest congestion and cough 21 tablet 0   promethazine-dextromethorphan (PROMETHAZINE-DM) 6.25-15 MG/5ML syrup Take 5 mLs by mouth 4 (four) times daily as needed for cough. 118 mL 0   No current facility-administered medications for this visit.    Allergies  Allergen Reactions   Amoxicillin Rash   Subective:  Patient arrived on time  for today's session.  Assessed progress.  Patient shared some ongoing stress in the relationship with his wife, how overall they are doing much better than they were a few years ago however, patient shared how they sometimes have breakdowns and communication going on to share how he feels they communicate differently.  He identified how he is worked on being more self disclosing with his feelings to his wife as opposed to suppressing where he was able to give some examples that ranged from further in the past to more present communication.  Facilitated his sharing outcomes, changes he feels that have occurred related to these efforts.  Contrasted these efforts and examples towards how he feels it affects his mood, referring to depression, anxiety.  Interventions: CBT, supportive therapy, problem solving  Diagnoses:  No diagnosis found.     Plan: Patient is to use CBT, mindfulness and coping skills to help manage decrease symptoms associated with their diagnosis.    Patient to continue to work on his more open communication in his relationships.  Patient to restart working out again as it is one of his outlets for stress reduction.  Long-term goal:   Reduce overall level, frequency, and intensity of the feelings of depression and anxiety 6-7/10 to a 0-2/10 in severity for at least 3 consecutive months.  Short-term goal:  Decrease "deflating, self-doubting" and catastrophizing thinking style Decrease feelings of guilt  Continue to improve his marital relationship through affective communication Utilize coping as discussed in session  Assessment of progress:  progressing  Waldron Session, [facility]

## 2022-07-19 ENCOUNTER — Ambulatory Visit (INDEPENDENT_AMBULATORY_CARE_PROVIDER_SITE_OTHER): Payer: No Typology Code available for payment source | Admitting: Mental Health

## 2022-07-19 DIAGNOSIS — F4323 Adjustment disorder with mixed anxiety and depressed mood: Secondary | ICD-10-CM | POA: Diagnosis not present

## 2022-07-19 NOTE — Progress Notes (Signed)
[facility] Psychotherapy Note  Name: TAKAI CHIARAMONTE Date: 07/19/22 MRN: [id number] DOB: 06-24-1986 PCP: Patient, No Pcp Per  Time spent: 55 minutes   Treatment:   Individual therapy  Mental Status Exam:    Appearance:   Casual     Behavior:  Appropriate  Motor:  Normal  Speech/Language:   Clear and Coherent  Affect:  Full Range  Mood:  Euthymic  Thought process:  normal  Thought content:    WNL  Sensory/Perceptual disturbances:    WNL  Orientation:  x4  Attention:  Good  Concentration:  Good  Memory:  WNL  Fund of knowledge:   Good  Insight:    Good  Judgment:   Good  Impulse Control:  Good   Reported Symptoms:   Anxiety, depressed mood, some sleep deficits, rumination at times  Risk Assessment: Danger to Self:  No Self-injurious Behavior: No Danger to Others: No Duty to Warn:no Physical Aggression / Violence:No  Access to Firearms a concern: No  Gang Involvement:no  Patient / guardian was educated about steps to take if suicide or homicide risk level increases between visits: yes While future psychiatric events cannot be accurately predicted, the patient does not currently require acute inpatient psychiatric care and does not currently meet [facility] involuntary commitment criteria.  Medications: Current Outpatient Medications  Medication Sig Dispense Refill   albuterol (VENTOLIN HFA) 108 (90 Base) MCG/ACT inhaler Inhale 2 puffs into the lungs every 6 (six) hours as needed for wheezing or shortness of breath (Cough). 18 g 1   guaifenesin (HUMIBID E) 400 MG TABS tablet Take 1 tablet 3 times daily as needed for chest congestion and cough 21 tablet 0   promethazine-dextromethorphan (PROMETHAZINE-DM) 6.25-15 MG/5ML syrup Take 5 mLs by mouth 4 (four) times daily as needed for cough. 118 mL 0   No current facility-administered medications for this visit.    Allergies  Allergen Reactions   Amoxicillin Rash   Subective:  Patient arrived on time for  today's session.  Assessed progress.  He shared pleasant experiences over the Christmas holiday.  He stated his wife continues to cope with significant medical issues related to allergic reactions.  He stated that she has followed through with medical appointments but at this point, they are awaiting test results to further understand causes for allergic reactions she is experiencing.  He stated that he continues to work on more open communication in their relationship going on to give examples, a meaningful discussion they had last night specifically where he checked in with her about how she is feeling and her day at work.  He shared some recent challenges with his reactions when upset, sharing experiences that occurred today with his son where he got frustrated with him due to their being late for school.  He stated that he got upset and raised his voice but they were able to talk through the situation in the car ride.  Patient parallel some of his reactions, expectations with his own upbringing, experiences in childhood.  Facilitated his further processing and identifying thoughts and feelings related, specifically that parallel his current reactions and identifying ways he would like to change.    Interventions: CBT, supportive therapy, problem solving  Diagnoses:    ICD-10-CM   1. Adjustment disorder with mixed anxiety and depressed mood  F43.23          Plan: Patient is to use CBT, mindfulness and coping skills to help manage decrease symptoms associated with their diagnosis.  Patient to continue to work on his more open communication in his relationships.  Patient to restart working out again as it is one of his outlets for stress reduction.  Long-term goal:   Reduce overall level, frequency, and intensity of the feelings of depression and anxiety 6-7/10 to a 0-2/10 in severity for at least 3 consecutive months.  Short-term goal:  Decrease "deflating, self-doubting" and catastrophizing  thinking style Decrease feelings of guilt  Continue to improve his marital relationship through affective communication Utilize coping as discussed in session   Assessment of progress:  progressing  Anson [place], [facility]

## 2022-08-21 ENCOUNTER — Ambulatory Visit: Payer: No Typology Code available for payment source | Admitting: Mental Health

## 2022-08-21 DIAGNOSIS — F4323 Adjustment disorder with mixed anxiety and depressed mood: Secondary | ICD-10-CM | POA: Diagnosis not present

## 2022-08-21 NOTE — Progress Notes (Signed)
[facility] Psychotherapy Note  Name: JOSIEL FRYREAR Date: 08/21/22 MRN: [id number] DOB: 13-May-1986 PCP: Patient, No Pcp Per  Time spent: 54 minutes   Treatment:   Individual therapy  Mental Status Exam:    Appearance:   Casual     Behavior:  Appropriate  Motor:  Normal  Speech/Language:   Clear and Coherent  Affect:  Full Range  Mood:  Euthymic  Thought process:  normal  Thought content:    WNL  Sensory/Perceptual disturbances:    WNL  Orientation:  x4  Attention:  Good  Concentration:  Good  Memory:  WNL  Fund of knowledge:   Good  Insight:    Good  Judgment:   Good  Impulse Control:  Good   Reported Symptoms:   Anxiety, depressed mood, some sleep deficits, rumination at times  Risk Assessment: Danger to Self:  No Self-injurious Behavior: No Danger to Others: No Duty to Warn:no Physical Aggression / Violence:No  Access to Firearms a concern: No  Gang Involvement:no  Patient / guardian was educated about steps to take if suicide or homicide risk level increases between visits: yes While future psychiatric events cannot be accurately predicted, the patient does not currently require acute inpatient psychiatric care and does not currently meet [facility] involuntary commitment criteria.  Medications: Current Outpatient Medications  Medication Sig Dispense Refill   albuterol (VENTOLIN HFA) 108 (90 Base) MCG/ACT inhaler Inhale 2 puffs into the lungs every 6 (six) hours as needed for wheezing or shortness of breath (Cough). 18 g 1   guaifenesin (HUMIBID E) 400 MG TABS tablet Take 1 tablet 3 times daily as needed for chest congestion and cough 21 tablet 0   promethazine-dextromethorphan (PROMETHAZINE-DM) 6.25-15 MG/5ML syrup Take 5 mLs by mouth 4 (four) times daily as needed for cough. 118 mL 0   No current facility-administered medications for this visit.    Allergies  Allergen Reactions   Amoxicillin Rash   Subective:  Patient arrived on time for  today's session.  Patient arrives on time for today session. Continue to explore relevant family history as discussed last session. He said he had a meaningful and challenging discussion with his brother yesterday in which patient learned about discussion of his brother have his father several months ago well his son was visiting him. Instead of the discussion triggered some distressful imagined related to past issues patient and with his father, specific to when he was 61 years old. Identifying how some of these memories and feelings are traumatic. For their identifying and crossing related thoughts, ways he kept with the situation which involved his going for runs which is part of his exercise regimen. I should identified how he feels he is increasing self awareness and enzyme and to past issues as well as how they impact his current relationships. Patient plans to follow up with further discussions with his wife for support.    Interventions: CBT, supportive therapy, problem solving  Diagnoses:    ICD-10-CM   1. Adjustment disorder with mixed anxiety and depressed mood  F43.23           Plan: Patient is to use CBT, mindfulness and coping skills to help manage decrease symptoms associated with their diagnosis.    Patient to continue to work on his more open communication in his relationships.  Patient to restart working out again as it is one of his outlets for stress reduction.  Long-term goal:   Reduce overall level, frequency, and intensity of the  feelings of depression and anxiety 6-7/10 to a 0-2/10 in severity for at least 3 consecutive months.  Short-term goal:  Decrease "deflating, self-doubting" and catastrophizing thinking style Decrease feelings of guilt  Continue to improve his marital relationship through affective communication Utilize coping as discussed in session   Assessment of progress:  progressing  [place]son [place], [facility]

## 2022-09-19 ENCOUNTER — Ambulatory Visit: Payer: No Typology Code available for payment source | Admitting: Mental Health

## 2022-09-19 DIAGNOSIS — F4323 Adjustment disorder with mixed anxiety and depressed mood: Secondary | ICD-10-CM

## 2022-09-20 NOTE — Progress Notes (Signed)
[facility] Psychotherapy Note  Name: Steven Gregory Date: 09/19/22 MRN: [id number] DOB: [birth year] PCP: Patient, No Pcp Per  Time spent: 50 minutes   Treatment:   Individual therapy  Mental Status Exam:    Appearance:   Casual     Behavior:  Appropriate  Motor:  Normal  Speech/Language:   Clear and Coherent  Affect:  Full Range  Mood:  Euthymic  Thought process:  normal  Thought content:    WNL  Sensory/Perceptual disturbances:    WNL  Orientation:  x4  Attention:  Good  Concentration:  Good  Memory:  WNL  Fund of knowledge:   Good  Insight:    Good  Judgment:   Good  Impulse Control:  Good   Reported Symptoms:   Anxiety, depressed mood, some sleep deficits, rumination at times  Risk Assessment: Danger to Self:  No Self-injurious Behavior: No Danger to Others: No Duty to Warn:no Physical Aggression / Violence:No  Access to Firearms a concern: No  Gang Involvement:no  Patient / guardian was educated about steps to take if suicide or homicide risk level increases between visits: yes While future psychiatric events cannot be accurately predicted, the patient does not currently require acute inpatient psychiatric care and does not currently meet [facility] involuntary commitment criteria.  Medications: Current Outpatient Medications  Medication Sig Dispense Refill   albuterol (VENTOLIN HFA) 108 (90 Base) MCG/ACT inhaler Inhale 2 puffs into the lungs every 6 (six) hours as needed for wheezing or shortness of breath (Cough). 18 g 1   guaifenesin (HUMIBID E) 400 MG TABS tablet Take 1 tablet 3 times daily as needed for chest congestion and cough 21 tablet 0   promethazine-dextromethorphan (PROMETHAZINE-DM) 6.25-15 MG/5ML syrup Take 5 mLs by mouth 4 (four) times daily as needed for cough. 118 mL 0   No current facility-administered medications for this visit.    Allergies  Allergen Reactions   Amoxicillin Rash   Subective:  Patient arrived on time for  today's session.  The patient has been focusing on improving his communication skills and enhancing his marital relationship. He shared examples of his efforts to follow through on this goal. The patient expressed satisfaction with his progress in communicating with his spouse. He provided specific examples of how he has actively listened and communicated more openly with her. Additionally, he mentioned that he and his wife are exploring the possibility of purchasing land to eventually build a house together. During our session, the patient expressed frustration regarding a recent development involving his parents. He learned that his mother, who had initially shown interest in purchasing land adjacent to their property, is now apprehensive and reluctant to proceed with the purchase. This revelation has caused him some confusion and disappointment. Through further exploration, the patient recognized that his mother's hesitation may stem from financial concerns. He acknowledged that both of his parents have struggled financially in recent years, although their situation has stabilized recently. This realization helped him understand his mother's perspective and empathize with her struggles. During our session, we discussed the benefits of open and honest communication within families, especially when making significant decisions like purchasing property. We explored strategies to address the patient's frustration and disappointment constructively, including expressing empathy towards his mother's financial concerns and discussing potential compromises.    Interventions: CBT, supportive therapy, problem solving  Diagnoses:  No diagnosis found.       Plan: Patient is to use CBT, mindfulness and coping skills to help manage decrease symptoms  associated with their diagnosis.    Patient to continue to work on his more open communication in his relationships.  Patient to restart working out again as it is one  of his outlets for stress reduction.  Long-term goal:   Reduce overall level, frequency, and intensity of the feelings of depression and anxiety 6-7/10 to a 0-2/10 in severity for at least 3 consecutive months.  Short-term goal:  Decrease "deflating, self-doubting" and catastrophizing thinking style Decrease feelings of guilt  Continue to improve his marital relationship through affective communication Utilize coping as discussed in session   Assessment of progress:  progressing  [place], [facility]

## 2022-10-17 ENCOUNTER — Ambulatory Visit: Payer: No Typology Code available for payment source | Admitting: Mental Health

## 2022-10-17 DIAGNOSIS — F4323 Adjustment disorder with mixed anxiety and depressed mood: Secondary | ICD-10-CM | POA: Diagnosis not present

## 2022-10-17 NOTE — Progress Notes (Signed)
[facility] Psychotherapy Note  Name: Steven Gregory Date:  10/17/22 MRN: [id number] DOB: 23-May-1986 PCP: Patient, No Pcp Per  Time spent: 51 minutes   Treatment:   Individual therapy  Mental Status Exam:    Appearance:   Casual     Behavior:  Appropriate  Motor:  Normal  Speech/Language:   Clear and Coherent  Affect:  Full Range  Mood:  Euthymic  Thought process:  normal  Thought content:    WNL  Sensory/Perceptual disturbances:    WNL  Orientation:  x4  Attention:  Good  Concentration:  Good  Memory:  WNL  Fund of knowledge:   Good  Insight:    Good  Judgment:   Good  Impulse Control:  Good   Reported Symptoms:   Anxiety, depressed mood, some sleep deficits, rumination at times  Risk Assessment: Danger to Self:  No Self-injurious Behavior: No Danger to Others: No Duty to Warn:no Physical Aggression / Violence:No  Access to Firearms a concern: No  Gang Involvement:no  Patient / guardian was educated about steps to take if suicide or homicide risk level increases between visits: yes While future psychiatric events cannot be accurately predicted, the patient does not currently require acute inpatient psychiatric care and does not currently meet [facility] involuntary commitment criteria.  Medications: Current Outpatient Medications  Medication Sig Dispense Refill   albuterol (VENTOLIN HFA) 108 (90 Base) MCG/ACT inhaler Inhale 2 puffs into the lungs every 6 (six) hours as needed for wheezing or shortness of breath (Cough). 18 g 1   guaifenesin (HUMIBID E) 400 MG TABS tablet Take 1 tablet 3 times daily as needed for chest congestion and cough 21 tablet 0   promethazine-dextromethorphan (PROMETHAZINE-DM) 6.25-15 MG/5ML syrup Take 5 mLs by mouth 4 (four) times daily as needed for cough. 118 mL 0   No current facility-administered medications for this visit.    Allergies  Allergen Reactions   Amoxicillin Rash   Subective:  Patient arrived on time  for today's session.  Patient shared recent events since last visit.  He shared how he and his wife decided not to buy the property as discussed last session.  He went on to share how a "weight has been lifted" and plans allow for he and his wife to take through time over the next couple of years to look for other potential property to build a home.  He shared how he had a helpful discussion with his mother as discussed last session; he stated they now have a better understanding which has also helped their relationship.  Other issues such as work-related stress were shared.  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  Through guided discovery, he identified ways to ensure he has some boundaries set for himself regarding work and home going on to share the challenges that his job has presented him over the years.   Interventions: CBT, supportive therapy, problem solving  Diagnoses:    ICD-10-CM   1. Adjustment disorder with mixed anxiety and depressed mood  F43.23            Plan: Patient is to use CBT, mindfulness and coping skills to help manage decrease symptoms associated with their diagnosis.    Patient to continue to work on his more open communication in his relationships.  Patient to restart working out again as it  is one of his outlets for stress reduction.  Long-term goal:   Reduce overall level, frequency, and intensity of the feelings of depression and anxiety 6-7/10 to a 0-2/10 in severity for at least 3 consecutive months.  Short-term goal:  Decrease "deflating, self-doubting" and catastrophizing thinking style Decrease feelings of guilt  Continue to improve his marital relationship through affective communication Utilize coping as discussed in session   Assessment of progress:  progressing  Waldron Session, [facility]

## 2022-11-12 ENCOUNTER — Ambulatory Visit: Payer: No Typology Code available for payment source | Admitting: Mental Health

## 2022-11-12 DIAGNOSIS — F4323 Adjustment disorder with mixed anxiety and depressed mood: Secondary | ICD-10-CM | POA: Diagnosis not present

## 2022-11-12 NOTE — Progress Notes (Signed)
[facility] Psychotherapy Note  Name: Steven Gregory Date:  11/12/22 MRN: [id number] DOB: [birth year] PCP: Patient, No Pcp Per  Time spent: 50 minutes   Treatment:   Individual therapy  Mental Status Exam:    Appearance:   Casual     Behavior:  Appropriate  Motor:  Normal  Speech/Language:   Clear and Coherent  Affect:  Full Range  Mood:  Euthymic  Thought process:  normal  Thought content:    WNL  Sensory/Perceptual disturbances:    WNL  Orientation:  x4  Attention:  Good  Concentration:  Good  Memory:  WNL  Fund of knowledge:   Good  Insight:    Good  Judgment:   Good  Impulse Control:  Good   Reported Symptoms:   Anxiety, depressed mood, some sleep deficits, rumination at times  Risk Assessment: Danger to Self:  No Self-injurious Behavior: No Danger to Others: No Duty to Warn:no Physical Aggression / Violence:No  Access to Firearms a concern: No  Gang Involvement:no  Patient / guardian was educated about steps to take if suicide or homicide risk level increases between visits: yes While future psychiatric events cannot be accurately predicted, the patient does not currently require acute inpatient psychiatric care and does not currently meet [facility] involuntary commitment criteria.  Medications: Current Outpatient Medications  Medication Sig Dispense Refill   albuterol (VENTOLIN HFA) 108 (90 Base) MCG/ACT inhaler Inhale 2 puffs into the lungs every 6 (six) hours as needed for wheezing or shortness of breath (Cough). 18 g 1   guaifenesin (HUMIBID E) 400 MG TABS tablet Take 1 tablet 3 times daily as needed for chest congestion and cough 21 tablet 0   promethazine-dextromethorphan (PROMETHAZINE-DM) 6.25-15 MG/5ML syrup Take 5 mLs by mouth 4 (four) times daily as needed for cough. 118 mL 0   No current facility-administered medications for this visit.    Allergies  Allergen Reactions   Amoxicillin Rash   Subective:  Patient arrived on time for  today's session.  Patient shared how a close friend is having issues in the relationship.  Patient stated that this issues with his wife were occurring about 3 years ago, and how this friend was aware.  He stated that he plans to be there for his friend to provide support if needed but also recognizes the need to give them space and some distance did not insert himself unnecessarily.  Patient went on to identify what he has learned about himself over the past few years both prior to his infidelity in his marriage and since that time.  He shared the ongoing working communication that both he and his wife put forth in their marriage and currently they are doing well.  He continues to experience some ongoing feelings of guilt about decisions he made at that time, we continue to work with patient to explore and identify ways he has been able to further understand himself and make changes with his communication in his marriage. Ways to continue to cope and care for himself were explored, he has continued to exercise, running specifically has been an ongoing outlet for stress management and overall health benefits.  Interventions: CBT, supportive therapy, problem solving  Diagnoses:    ICD-10-CM   1. Adjustment disorder with mixed anxiety and depressed mood  F43.23             Plan: Patient is to use CBT, mindfulness and coping skills to help manage decrease symptoms associated with their diagnosis.  Patient to continue to work on his more open communication in his relationships.  Patient to restart working out again as it is one of his outlets for stress reduction.  Long-term goal:   Reduce overall level, frequency, and intensity of the feelings of depression and anxiety 6-7/10 to a 0-2/10 in severity for at least 3 consecutive months.  Short-term goal:  Decrease "deflating, self-doubting" and catastrophizing thinking style Decrease feelings of guilt  Continue to improve his marital relationship  through effective communication Utilize coping as discussed in session   Assessment of progress:  progressing  Waldron Session, [facility]

## 2023-07-31 ENCOUNTER — Ambulatory Visit: Payer: 59 | Admitting: Mental Health

## 2023-07-31 DIAGNOSIS — F4323 Adjustment disorder with mixed anxiety and depressed mood: Secondary | ICD-10-CM

## 2023-07-31 NOTE — Progress Notes (Signed)
[facility] Psychotherapy Note  Name: Steven Gregory Date:  07/31/23 MRN: [id number] DOB: [birth year] PCP: Patient, No Pcp Per  Time spent: 50 minutes   Treatment:   Individual therapy  Mental Status Exam:    Appearance:   Casual     Behavior:  Appropriate  Motor:  Normal  Speech/Language:   Clear and Coherent  Affect:  Full Range  Mood:  Euthymic  Thought process:  normal  Thought content:    WNL  Sensory/Perceptual disturbances:    WNL  Orientation:  x4  Attention:  Good  Concentration:  Good  Memory:  WNL  Fund of knowledge:   Good  Insight:    Good  Judgment:   Good  Impulse Control:  Good   Reported Symptoms:   Anxiety, depressed mood, some sleep deficits, rumination at times  Risk Assessment: Danger to Self:  No Self-injurious Behavior: No Danger to Others: No Duty to Warn:no Physical Aggression / Violence:No  Access to Firearms a concern: No  Gang Involvement:no  Patient / guardian was educated about steps to take if suicide or homicide risk level increases between visits: yes While future psychiatric events cannot be accurately predicted, the patient does not currently require acute inpatient psychiatric care and does not currently meet [facility] involuntary commitment criteria.  Medications: Current Outpatient Medications  Medication Sig Dispense Refill   albuterol (VENTOLIN HFA) 108 (90 Base) MCG/ACT inhaler Inhale 2 puffs into the lungs every 6 (six) hours as needed for wheezing or shortness of breath (Cough). 18 g 1   guaifenesin (HUMIBID E) 400 MG TABS tablet Take 1 tablet 3 times daily as needed for chest congestion and cough 21 tablet 0   promethazine-dextromethorphan (PROMETHAZINE-DM) 6.25-15 MG/5ML syrup Take 5 mLs by mouth 4 (four) times daily as needed for cough. 118 mL 0   No current facility-administered medications for this visit.    Allergies  Allergen Reactions   Amoxicillin Rash   Subective:  Patient arrived on time  for today's session.  Patient shared events, progress since last visit which was approximately 7 months ago.  He stated that he is return to therapy due to concerns about his son, and associated stress due to these concerns.  He went on to share details, how he is had to cope with losses over the years, friends that passed away and most recently about 2 weeks ago he had a close friend from college that had passed.  He stated that he and his wife made his son aware and since that time their son has been more anxious particularly when he has to separate from them to go to school.  Patient plans to locate a therapist for his son.  We discussed how this may be helpful.  Facilitated his sharing feelings of concern as well as other challenges his son is having behaviorally at school as they recently had him attend a [employer] school.  He is currently having his son evaluated for ADHD, patient feeling strongly that his son copes with this diagnosis.  He is hesitant to give him medication if prescribed.  We encouraged him to ask all questions of the prescribing doctor if an appointment is scheduled.  Patient was also encouraged to recognize the support he is trying to give his son, as well as ways he has been effective in communicating in his marital relationship as he states this has been going well.  Interventions: CBT, supportive therapy, problem solving  Diagnoses:  No diagnosis found.  Plan: Patient is to use CBT, mindfulness and coping skills to help manage decrease symptoms associated with their diagnosis.    Patient to continue to work on his more open communication in his relationships.  Patient to restart working out again as it is one of his outlets for stress reduction.  Long-term goal:   Reduce overall level, frequency, and intensity of the feelings of depression and anxiety 6-7/10 to a 0-2/10 in severity for at least 3 consecutive months.  Short-term goal:  Decrease "deflating,  self-doubting" and catastrophizing thinking style Decrease feelings of guilt  Continue to improve his marital relationship through effective communication Utilize coping as discussed in session   Assessment of progress:  progressing  Waldron Session, [facility]

## 2023-08-18 ENCOUNTER — Ambulatory Visit: Payer: 59 | Admitting: Mental Health

## 2023-08-18 DIAGNOSIS — F4323 Adjustment disorder with mixed anxiety and depressed mood: Secondary | ICD-10-CM

## 2023-08-18 NOTE — Progress Notes (Signed)
 [facility] Psychotherapy Note  Name: Steven Gregory Date: 08/18/23 MRN: [id number] DOB: 12-17-1985 PCP: Patient, No Pcp Per  Time spent: 51 minutes   Treatment:   Individual therapy  Mental Status Exam:    Appearance:   Casual     Behavior:  Appropriate  Motor:  Normal  Speech/Language:   Clear and Coherent  Affect:  Full Range  Mood:  Euthymic  Thought process:  normal  Thought content:    WNL  Sensory/Perceptual disturbances:    WNL  Orientation:  x4  Attention:  Good  Concentration:  Good  Memory:  WNL  Fund of knowledge:   Good  Insight:    Good  Judgment:   Good  Impulse Control:  Good   Reported Symptoms:   Anxiety, depressed mood, some sleep deficits, rumination at times  Risk Assessment: Danger to Self:  No Self-injurious Behavior: No Danger to Others: No Duty to Warn:no Physical Aggression / Violence:No  Access to Firearms a concern: No  Gang Involvement:no  Patient / guardian was educated about steps to take if suicide or homicide risk level increases between visits: yes While future psychiatric events cannot be accurately predicted, the patient does not currently require acute inpatient psychiatric care and does not currently meet [place]  involuntary commitment criteria.  Medications: Current Outpatient Medications  Medication Sig Dispense Refill   albuterol  (VENTOLIN  HFA) 108 (90 Base) MCG/ACT inhaler Inhale 2 puffs into the lungs every 6 (six) hours as needed for wheezing or shortness of breath (Cough). 18 g 1   guaifenesin  (HUMIBID E) 400 MG TABS tablet Take 1 tablet 3 times daily as needed for chest congestion and cough 21 tablet 0   promethazine -dextromethorphan (PROMETHAZINE -DM) 6.25-15 MG/5ML syrup Take 5 mLs by mouth 4 (four) times daily as needed for cough. 118 mL 0   No current facility-administered medications for this visit.    Allergies  Allergen Reactions   Amoxicillin Rash   Subective:  Patient arrived on time for  today's session.  Assessed progress per patient shared recent challenges with his son.  He stated that he and his wife learned from his school that he cannot return next year due to behavioral concerns.  He stated that his son needs a different type of learning environment that is more traditionally structured he believes.  They plan to look at other options.  Complete the school year at his current school.  Provide support and time for patient to process details and feelings related.  He shared another instance where his son was speaking with his father, a conversation was upsetting the patient and reminded about how he was treated during his own childhood, some specific things his father told him that were hurtful.  He went on to share other experiences throughout childhood and adulthood where his father rarely, if ever gives him any time with praise.  Through further guided discovery, he identified the potential benefits of his writing a letter to his father to organize his thoughts as he feels it will be helpful for their relationship, but also for himself to discuss with his father and share feelings; how this can also affect his own sense of self and work through unresolved feelings was explored collaboratively.  Interventions: CBT, supportive therapy, problem solving  Diagnoses:  No diagnosis found.         Plan: Patient is to use CBT, mindfulness and coping skills to help manage decrease symptoms associated with their diagnosis.    Patient to continue  to work on his more open communication in his relationships.  Patient to restart working out again as it is one of his outlets for stress reduction.  Long-term goal:   Reduce overall level, frequency, and intensity of the feelings of depression and anxiety 6-7/10 to a 0-2/10 in severity for at least 3 consecutive months.  Short-term goal:  Decrease "deflating, self-doubting" and catastrophizing thinking style Decrease feelings of guilt   Continue to improve his marital relationship through effective communication Utilize coping as discussed in session   Assessment of progress:  progressing  Avram Lenis, [facility]

## 2023-08-29 ENCOUNTER — Ambulatory Visit: Payer: 59 | Admitting: Mental Health

## 2023-08-29 NOTE — Progress Notes (Signed)
 No charge for 1st no show 08/29/23.
